# Patient Record
Sex: Female | Born: 1985
Health system: Southern US, Community
[De-identification: ages and names within clinical notes are randomized; demographics above are authoritative.]

## PROBLEM LIST (undated history)

## (undated) DIAGNOSIS — Z973 Presence of spectacles and contact lenses: Secondary | ICD-10-CM

## (undated) DIAGNOSIS — R519 Headache, unspecified: Secondary | ICD-10-CM

## (undated) DIAGNOSIS — G4730 Sleep apnea, unspecified: Secondary | ICD-10-CM

## (undated) DIAGNOSIS — N939 Abnormal uterine and vaginal bleeding, unspecified: Secondary | ICD-10-CM

## (undated) DIAGNOSIS — Z8669 Personal history of other diseases of the nervous system and sense organs: Secondary | ICD-10-CM

## (undated) DIAGNOSIS — I429 Cardiomyopathy, unspecified: Secondary | ICD-10-CM

## (undated) DIAGNOSIS — I509 Heart failure, unspecified: Secondary | ICD-10-CM

## (undated) DIAGNOSIS — D649 Anemia, unspecified: Secondary | ICD-10-CM

## (undated) HISTORY — DX: Heart failure, unspecified: I50.9

## (undated) HISTORY — PX: ENDOMETRIAL ABLATION: SHX621

## (undated) HISTORY — PX: APPENDECTOMY: SHX54

## (undated) HISTORY — DX: Anemia, unspecified: D64.9

---

## 2007-12-12 DIAGNOSIS — Z9289 Personal history of other medical treatment: Secondary | ICD-10-CM

## 2007-12-12 DIAGNOSIS — I429 Cardiomyopathy, unspecified: Secondary | ICD-10-CM

## 2007-12-12 HISTORY — DX: Personal history of other medical treatment: Z92.89

## 2007-12-12 HISTORY — DX: Cardiomyopathy, unspecified: I42.9

## 2016-07-17 DIAGNOSIS — N879 Dysplasia of cervix uteri, unspecified: Secondary | ICD-10-CM

## 2016-07-17 HISTORY — DX: Dysplasia of cervix uteri, unspecified: N87.9

## 2017-06-20 DIAGNOSIS — O0930 Supervision of pregnancy with insufficient antenatal care, unspecified trimester: Secondary | ICD-10-CM

## 2017-06-20 DIAGNOSIS — O4440 Low lying placenta NOS or without hemorrhage, unspecified trimester: Secondary | ICD-10-CM | POA: Insufficient documentation

## 2017-06-20 HISTORY — DX: Low lying placenta nos or without hemorrhage, unspecified trimester: O44.40

## 2017-06-20 HISTORY — DX: Supervision of pregnancy with insufficient antenatal care, unspecified trimester: O09.30

## 2017-06-21 ENCOUNTER — Other Ambulatory Visit (HOSPITAL_COMMUNITY): Payer: Self-pay | Admitting: Obstetrics and Gynecology

## 2017-06-22 ENCOUNTER — Encounter (HOSPITAL_COMMUNITY): Payer: Self-pay | Admitting: *Deleted

## 2017-06-26 ENCOUNTER — Ambulatory Visit (HOSPITAL_COMMUNITY)
Admission: RE | Admit: 2017-06-26 | Discharge: 2017-06-26 | Disposition: A | Discharge status: Home / Self Care | Payer: BLUE CROSS/BLUE SHIELD | Source: Ambulatory Visit | Attending: Obstetrics and Gynecology | Admitting: Obstetrics and Gynecology

## 2017-06-26 ENCOUNTER — Encounter (HOSPITAL_COMMUNITY): Payer: Self-pay

## 2017-06-26 DIAGNOSIS — F17210 Nicotine dependence, cigarettes, uncomplicated: Secondary | ICD-10-CM | POA: Insufficient documentation

## 2017-06-26 DIAGNOSIS — Z3A29 29 weeks gestation of pregnancy: Secondary | ICD-10-CM | POA: Diagnosis not present

## 2017-06-26 DIAGNOSIS — O09893 Supervision of other high risk pregnancies, third trimester: Secondary | ICD-10-CM | POA: Insufficient documentation

## 2017-06-26 DIAGNOSIS — Z8679 Personal history of other diseases of the circulatory system: Secondary | ICD-10-CM | POA: Insufficient documentation

## 2017-06-26 DIAGNOSIS — O99333 Smoking (tobacco) complicating pregnancy, third trimester: Secondary | ICD-10-CM | POA: Insufficient documentation

## 2017-06-26 HISTORY — DX: Cardiomyopathy, unspecified: I42.9

## 2017-06-26 NOTE — Consult Note (Signed)
Maternal Fetal Medicine Consultation  Requesting Provider(s): Dr. Lamona Curl  Reason for consultation: History of peripartum cardiomyopathy  HPI: Kristin Parrish is a 31 yo G4P3003, EDD 09/06/2017 who is currently at 29w 5d seen for consultation due to a personal history of peripartum cardiomyopathy.  Kristin Parrish reports that approximately 48 hours following her delivery in 2009, she developed shortness of breath and was admitted to the ICU at [facility] for approximately 4 days.  Unfortunately, records from that admission are not available.  She reports that she was discharged home on Lisinopril and "a water pill".  She was followed by cardiology for approximately 4 months and had complete resolution of her symptoms and her follow up echo by her report was normal.  She has not seen a cardiologist since 2010, but is scheduled to see a Cardiologist in [place] on the 25th of July.  This was an unplanned pregnancy and she only recently initiated care - she reports that her pregnancy in 2009 was associated with diffuse swelling and weight gain that she has not experienced up to this point.  She denies chest pain, shortness of breath or PND. The fetus is active.  OB History: OB History    Gravida Para Term Preterm AB Living   4 3 3     3    SAB TAB Ectopic Multiple Live Births                  PMH:  Past Medical History:  Diagnosis Date  . Cardiomyopathy (HCC)    postpartum    PSH:  Past Surgical History:  Procedure Laterality Date  . APPENDECTOMY     Meds:  No current outpatient prescriptions on file prior to encounter.   No current facility-administered medications on file prior to encounter.    Allergies: No Known Allergies   FH: Denies family history of birth defects or hereditary disorders  Soc:  Social History   Social History  . Marital status: Married    Spouse name: N/A  . Number of children: N/A  . Years of education: N/A   Occupational History  . Not on file.    Social History Main Topics  . Smoking status: Current Some Day Smoker    Packs/day: 0.25    Types: Cigarettes  . Smokeless tobacco: Never Used  . Alcohol use No  . Drug use: No  . Sexual activity: Not on file   Other Topics Concern  . Not on file   Social History Narrative  . No narrative on file    Review of Systems: no vaginal bleeding or cramping/contractions, no LOF, no nausea/vomiting. All other systems reviewed and are negative.  PE:   Vitals:   06/26/17 0857  BP: 105/71  Pulse: 95    A/P: 1) Single IUP at 29w 5d  2) History of peripartum cardiomyopathy  - Records were not available for the patient's previous hospitalization at the time of her evaluation.  From her discussion, she was released from Cardiology care some 4 months following her delivery and has not seen a cardiologist since that time.   We had a long discussion regarding recurrence risks of peripartum cardiomyopathy.  Fortunately, it would appear that her LV function improved and she has been asymptomatic.   Among women in whom LV function returns to normal, the risk of subsequent pregnancy appears lower than for those with persistent LV dysfunction, but elevated compared with the general population. In a series of 28 women who recovered to an  LVEF ?50 percent after the initial episode, the following results in subsequent pregnancies were noted (Elkayam U, et al. Kavin LeechNEJM [id number]):  -There were no deaths -There was a reduction in the mean LVEF (56 to 49 percent), and the LVEF fell by more than 20 percent in six women (21 percent) - Six patients developed HF symptoms  Given the patients risk for worsening LV function and heart failure (~ 20%), I would recommend that her care be transferred [facility] ([facility]).  I have initiated the paperwork to a transfer OB appointment.  Recommendations: 1) [facility] 2) Cardiology  consultation and Echo as some as possible - will likely need follow up with cardiology again prior to delivery 3) Consider telemetry during labor 4) Will need to remain in house post partum for at least 72 hours due to fluid shifts after delivery.  Thank you for the opportunity to be a part of the care of Kristin Parrish. Please contact our office if we can be of further assistance.   I spent approximately 30 minutes with this patient with over 50% of time spent in face-to-face counseling.   Alpha GulaPaul Rianne Degraaf, MD Maternal Fetal Medicine

## 2017-06-28 ENCOUNTER — Encounter (HOSPITAL_COMMUNITY): Payer: Self-pay

## 2017-06-28 DIAGNOSIS — O903 Peripartum cardiomyopathy: Secondary | ICD-10-CM

## 2017-06-28 DIAGNOSIS — Z720 Tobacco use: Secondary | ICD-10-CM | POA: Insufficient documentation

## 2017-06-28 DIAGNOSIS — O34513 Maternal care for incarceration of gravid uterus, third trimester: Secondary | ICD-10-CM

## 2017-06-28 HISTORY — DX: Tobacco use: Z72.0

## 2017-06-28 HISTORY — DX: Maternal care for incarceration of gravid uterus, third trimester: O34.513

## 2017-06-28 HISTORY — DX: Peripartum cardiomyopathy: O90.3

## 2017-11-10 HISTORY — PX: OTHER SURGICAL HISTORY: SHX169

## 2017-11-29 ENCOUNTER — Encounter (HOSPITAL_COMMUNITY): Payer: Self-pay

## 2020-12-11 DIAGNOSIS — Z8616 Personal history of COVID-19: Secondary | ICD-10-CM

## 2020-12-11 HISTORY — DX: Personal history of COVID-19: Z86.16

## 2021-01-17 ENCOUNTER — Ambulatory Visit (INDEPENDENT_AMBULATORY_CARE_PROVIDER_SITE_OTHER): Payer: 59 | Admitting: Cardiology

## 2021-01-17 ENCOUNTER — Ambulatory Visit (INDEPENDENT_AMBULATORY_CARE_PROVIDER_SITE_OTHER): Payer: 59

## 2021-01-17 ENCOUNTER — Other Ambulatory Visit: Payer: Self-pay

## 2021-01-17 VITALS — BP 110/76 | HR 73 | Ht 63.0 in | Wt 126.0 lb

## 2021-01-17 DIAGNOSIS — D509 Iron deficiency anemia, unspecified: Secondary | ICD-10-CM | POA: Diagnosis not present

## 2021-01-17 DIAGNOSIS — O903 Peripartum cardiomyopathy: Secondary | ICD-10-CM

## 2021-01-17 DIAGNOSIS — G4733 Obstructive sleep apnea (adult) (pediatric): Secondary | ICD-10-CM | POA: Diagnosis not present

## 2021-01-17 DIAGNOSIS — U071 COVID-19: Secondary | ICD-10-CM

## 2021-01-17 DIAGNOSIS — Z720 Tobacco use: Secondary | ICD-10-CM | POA: Diagnosis not present

## 2021-01-17 DIAGNOSIS — E8881 Metabolic syndrome: Secondary | ICD-10-CM

## 2021-01-17 LAB — ECHOCARDIOGRAM COMPLETE
Area-P 1/2: 3.51 cm2
Height: 63 in
S' Lateral: 3.6 cm
Weight: 2016 oz

## 2021-01-17 NOTE — Progress Notes (Signed)
KG

## 2021-01-17 NOTE — Patient Instructions (Signed)
Medication Instructions:  Your physician recommends that you continue on your current medications as directed. Please refer to the Current Medication list given to you today.  *If you need a refill on your cardiac medications before your next appointment, please call your pharmacy*   Lab Work: Your physician recommends that you return for lab work: Tomorrow Lipids (fasting blood work)  If you have labs (blood work) drawn today and your tests are completely normal, you will receive your results only by: Marland Kitchen MyChart Message (if you have MyChart) OR . A paper copy in the mail If you have any lab test that is abnormal or we need to change your treatment, we will call you to review the results.   Testing/Procedures: Your physician has requested that you have an echocardiogram. Echocardiography is a painless test that uses sound waves to create images of your heart. It provides your doctor with information about the size and shape of your heart and how well your heart's chambers and valves are working. This procedure takes approximately one hour. There are no restrictions for this procedure.    Follow-Up: At [facility], you and your health needs are our priority.  As part of our continuing mission to provide you with exceptional heart care, we have created designated Provider Care Teams.  These Care Teams include your primary Cardiologist (physician) and Advanced Practice Providers (APPs -  Physician Assistants and Nurse Practitioners) who all work together to provide you with the care you need, when you need it.  We recommend signing up for the patient portal called "MyChart".  Sign up information is provided on this After Visit Summary.  MyChart is used to connect with patients for Virtual Visits (Telemedicine).  Patients are able to view lab/test results, encounter notes, upcoming appointments, etc.  Non-urgent messages can be sent to your provider as well.   To learn more about what you can  do with MyChart, go to [email or web address].    Your next appointment:    As needed  The format for your next appointment:   In Person  Provider:   Thomasene Ripple, DO   Other Instructions

## 2021-01-17 NOTE — Progress Notes (Signed)
Complete echocardiogram has been performed.  Jimmy Pharrah Rottman RDCS, RVT 

## 2021-01-17 NOTE — Progress Notes (Signed)
Cardiology Office Note:    Date:  01/17/2021   ID:  Kristin Parrish, DOB [birth year], MRN [id number]  PCP:  Abner Greenspan, MD  Cardiologist:  Thomasene Ripple, DO  Electrophysiologist:  None   Referring MD: Abner Greenspan, MD   Recent covid infection  History of Present Illness:    Kristin Parrish is a 35 y.o. female with a hx of postpartum cardiomyopathy, iron deficiency anemia, tobacco use, sleep apnea is here today to be evaluated for mild shortness of breath post COVID-19 infection.  The patient tells me that during the third trimester of her third pregnancy she experienced some intermittent shortness of breath and bilateral leg edema.  At that time it was noted to be related to her pregnancy.  The patient did have significant anemia after delivery with shortness of breath which led her back to the emergency department at [facility].  During that visit she was started on IV Lasix as well as treated in the ICU as she did have severe anemia.  At that time she was noted to have postpartum cardiomyopathy unfortunately she does not remember what her EF was.  She did follow-up with cardiology for short while in was titrated off her medications eventually.  Not seen cardiology since.  She is concerned recently given her history of postpartum cardiomyopathy her recent COVID-19 infection to get back and establish with cardiology.  No other complaints at this time.  Past Medical History:  Diagnosis Date  . Cardiomyopathy (HCC)    postpartum  . Cervical dysplasia 07/17/2016  . Incarcerated gravid uterus in third trimester 06/28/2017  . Late prenatal care 06/20/2017  . Low-lying placenta 06/20/2017   Overview:  Repeat u/s in 4 weeks  . Postpartum cardiomyopathy 06/28/2017   postpartum  . Tobacco abuse 06/28/2017    Past Surgical History:  Procedure Laterality Date  . APPENDECTOMY      Current Medications: No outpatient medications have been marked as taking for the 01/17/21 encounter (Office Visit)  with Thomasene Ripple, DO.     Allergies:   Patient has no known allergies.   Social History   Socioeconomic History  . Marital status: Legally Separated    Spouse name: Not on file  . Number of children: Not on file  . Years of education: Not on file  . Highest education level: Not on file  Occupational History  . Not on file  Tobacco Use  . Smoking status: Current Some Day Smoker    Packs/day: 0.25    Types: Cigarettes  . Smokeless tobacco: Never Used  Substance and Sexual Activity  . Alcohol use: No  . Drug use: No  . Sexual activity: Not on file  Other Topics Concern  . Not on file  Social History Narrative  . Not on file   Social Determinants of Health   Financial Resource Strain: Not on file  Food Insecurity: Not on file  Transportation Needs: Not on file  Physical Activity: Not on file  Stress: Not on file  Social Connections: Not on file     Family History: The patient's family history includes Heart Problems in her paternal grandfather.  ROS:   Review of Systems  Constitution: Negative for decreased appetite, fever and weight gain.  HENT: Negative for congestion, ear discharge, hoarse voice and sore throat.   Eyes: Negative for discharge, redness, vision loss in right eye and visual halos.  Cardiovascular: Negative for chest pain, dyspnea on exertion, leg swelling, orthopnea and palpitations.  Respiratory: Negative for  cough, hemoptysis, shortness of breath and snoring.   Endocrine: Negative for heat intolerance and polyphagia.  Hematologic/Lymphatic: Negative for bleeding problem. Does not bruise/bleed easily.  Skin: Negative for flushing, nail changes, rash and suspicious lesions.  Musculoskeletal: Negative for arthritis, joint pain, muscle cramps, myalgias, neck pain and stiffness.  Gastrointestinal: Negative for abdominal pain, bowel incontinence, diarrhea and excessive appetite.  Genitourinary: Negative for decreased libido, genital sores and incomplete  emptying.  Neurological: Negative for brief paralysis, focal weakness, headaches and loss of balance.  Psychiatric/Behavioral: Negative for altered mental status, depression and suicidal ideas.  Allergic/Immunologic: Negative for HIV exposure and persistent infections.    EKGs/Labs/Other Studies Reviewed:    The following studies were reviewed today:   EKG:  The ekg ordered today demonstrates sinus rhythm, heart rate 72 bpm with nonspecific ST changes.  Recent Labs: No results found for requested labs within last 8760 hours.  Recent Lipid Panel No results found for: CHOL, TRIG, HDL, CHOLHDL, VLDL, LDLCALC, LDLDIRECT  Physical Exam:    VS:  BP 110/76 (BP Location: Left Arm)   Pulse 73   Ht 5\' 3"  (1.6 m)   Wt 126 lb (57.2 kg)   SpO2 98%   BMI 22.32 kg/m     Wt Readings from Last 3 Encounters:  01/17/21 126 lb (57.2 kg)  06/26/17 151 lb 3.2 oz (68.6 kg)     GEN: Well nourished, well developed in no acute distress HEENT: Normal NECK: No JVD; No carotid bruits LYMPHATICS: No lymphadenopathy CARDIAC: S1S2 noted,RRR, no murmurs, rubs, gallops RESPIRATORY:  Clear to auscultation without rales, wheezing or rhonchi  ABDOMEN: Soft, non-tender, non-distended, +bowel sounds, no guarding. EXTREMITIES: No edema, No cyanosis, no clubbing MUSCULOSKELETAL:  No deformity  SKIN: Warm and dry NEUROLOGIC:  Alert and oriented x 3, non-focal PSYCHIATRIC:  Normal affect, good insight  ASSESSMENT:    1. COVID-19   2. Postpartum cardiomyopathy   3. Iron deficiency anemia, unspecified iron deficiency anemia type   4. Metabolic syndrome   5. Tobacco abuse   6. OSA (obstructive sleep apnea)    PLAN:     What I would like to do is get an echocardiogram to establish a new baseline for her LVEF.  Especially in the setting of her recent COVID-19 infection and her history of postpartum cardiomyopathy.  We also discussed for getting a baseline on the lipid profile.  Smoking cessation  advised.  The patient is in agreement with the above plan. The patient left the office in stable condition.  The patient will follow up as needed   Medication Adjustments/Labs and Tests Ordered: Current medicines are reviewed at length with the patient today.  Concerns regarding medicines are outlined above.  Orders Placed This Encounter  Procedures  . Lipid panel  . EKG 12-Lead  . ECHOCARDIOGRAM COMPLETE   No orders of the defined types were placed in this encounter.   Patient Instructions  Medication Instructions:  Your physician recommends that you continue on your current medications as directed. Please refer to the Current Medication list given to you today.  *If you need a refill on your cardiac medications before your next appointment, please call your pharmacy*   Lab Work: Your physician recommends that you return for lab work: Tomorrow Lipids (fasting blood work)  If you have labs (blood work) drawn today and your tests are completely normal, you will receive your results only by: 06/28/17 MyChart Message (if you have MyChart) OR . A paper copy in the mail  If you have any lab test that is abnormal or we need to change your treatment, we will call you to review the results.   Testing/Procedures: Your physician has requested that you have an echocardiogram. Echocardiography is a painless test that uses sound waves to create images of your heart. It provides your doctor with information about the size and shape of your heart and how well your heart's chambers and valves are working. This procedure takes approximately one hour. There are no restrictions for this procedure.    Follow-Up: At [facility], you and your health needs are our priority.  As part of our continuing mission to provide you with exceptional heart care, we have created designated Provider Care Teams.  These Care Teams include your primary Cardiologist (physician) and Advanced Practice Providers (APPs -   Physician Assistants and Nurse Practitioners) who all work together to provide you with the care you need, when you need it.  We recommend signing up for the patient portal called "MyChart".  Sign up information is provided on this After Visit Summary.  MyChart is used to connect with patients for Virtual Visits (Telemedicine).  Patients are able to view lab/test results, encounter notes, upcoming appointments, etc.  Non-urgent messages can be sent to your provider as well.   To learn more about what you can do with MyChart, go to [email or web address].    Your next appointment:    As needed  The format for your next appointment:   In Person  Provider:   Thomasene Ripple, DO   Other Instructions      Adopting a Healthy Lifestyle.  Know what a healthy weight is for you (roughly BMI <25) and aim to maintain this   Aim for 7+ servings of fruits and vegetables daily   65-80+ fluid ounces of water or unsweet tea for healthy kidneys   Limit to max 1 drink of alcohol per day; avoid smoking/tobacco   Limit animal fats in diet for cholesterol and heart health - choose grass fed whenever available   Avoid highly processed foods, and foods high in saturated/trans fats   Aim for low stress - take time to unwind and care for your mental health   Aim for 150 min of moderate intensity exercise weekly for heart health, and weights twice weekly for bone health   Aim for 7-9 hours of sleep daily   When it comes to diets, agreement about the perfect plan isnt easy to find, even among the experts. Experts at the [facility] [employer] developed an idea known as the Healthy Eating Plate. Just imagine a plate divided into logical, healthy portions.   The emphasis is on diet quality:   Load up on vegetables and fruits - one-half of your plate: Aim for color and variety, and remember that potatoes dont count.   Go for whole grains - one-quarter of your plate: Whole wheat, barley, wheat  berries, quinoa, oats, brown rice, and foods made with them. If you want pasta, go with whole wheat pasta.   Protein power - one-quarter of your plate: Fish, chicken, beans, and nuts are all healthy, versatile protein sources. Limit red meat.   The diet, however, does go beyond the plate, offering a few other suggestions.   Use healthy plant oils, such as olive, canola, soy, corn, sunflower and peanut. Check the labels, and avoid partially hydrogenated oil, which have unhealthy trans fats.   If youre thirsty, drink water. Coffee and tea are good in  moderation, but skip sugary drinks and limit milk and dairy products to one or two daily servings.   The type of carbohydrate in the diet is more important than the amount. Some sources of carbohydrates, such as vegetables, fruits, whole grains, and beans-are healthier than others.   Finally, stay active  Signed, Thomasene Ripple, DO  01/17/2021 12:12 PM    [facility]

## 2021-01-27 LAB — LIPID PANEL
Chol/HDL Ratio: 2.5 ratio (ref 0.0–4.4)
Cholesterol, Total: 148 mg/dL (ref 100–199)
HDL: 60 mg/dL (ref >39)
LDL Chol Calc (NIH): 77 mg/dL (ref 0–99)
Triglycerides: 51 mg/dL (ref 0–149)
VLDL Cholesterol Cal: 11 mg/dL (ref 5–40)

## 2021-04-26 DIAGNOSIS — F4321 Adjustment disorder with depressed mood: Secondary | ICD-10-CM | POA: Diagnosis not present

## 2021-06-07 ENCOUNTER — Other Ambulatory Visit: Payer: Self-pay

## 2021-06-07 ENCOUNTER — Encounter: Payer: Self-pay | Admitting: Family Medicine

## 2021-06-07 ENCOUNTER — Ambulatory Visit (INDEPENDENT_AMBULATORY_CARE_PROVIDER_SITE_OTHER): Payer: 59 | Admitting: Family Medicine

## 2021-06-07 VITALS — BP 102/64 | HR 74 | Temp 98.5°F | Ht 64.0 in | Wt 130.0 lb

## 2021-06-07 DIAGNOSIS — Z8669 Personal history of other diseases of the nervous system and sense organs: Secondary | ICD-10-CM

## 2021-06-07 DIAGNOSIS — Z0000 Encounter for general adult medical examination without abnormal findings: Secondary | ICD-10-CM

## 2021-06-07 DIAGNOSIS — Z1159 Encounter for screening for other viral diseases: Secondary | ICD-10-CM | POA: Diagnosis not present

## 2021-06-07 LAB — COMPREHENSIVE METABOLIC PANEL
ALT: 10 U/L (ref 0–35)
AST: 13 U/L (ref 0–37)
Albumin: 4.4 g/dL (ref 3.5–5.2)
Alkaline Phosphatase: 69 U/L (ref 39–117)
BUN: 12 mg/dL (ref 6–23)
CO2: 28 mEq/L (ref 19–32)
Calcium: 9.2 mg/dL (ref 8.4–10.5)
Chloride: 103 mEq/L (ref 96–112)
Creatinine, Ser: 0.7 mg/dL (ref 0.40–1.20)
GFR: 111.76 mL/min (ref >60.00)
Glucose, Bld: 81 mg/dL (ref 70–99)
Potassium: 3.8 mEq/L (ref 3.5–5.1)
Sodium: 137 mEq/L (ref 135–145)
Total Bilirubin: 0.4 mg/dL (ref 0.2–1.2)
Total Protein: 6.7 g/dL (ref 6.0–8.3)

## 2021-06-07 LAB — CBC
HCT: 38 % (ref 36.0–46.0)
Hemoglobin: 12.6 g/dL (ref 12.0–15.0)
MCHC: 33.2 g/dL (ref 30.0–36.0)
MCV: 94.1 fl (ref 78.0–100.0)
Platelets: 220 10*3/uL (ref 150.0–400.0)
RBC: 4.04 Mil/uL (ref 3.87–5.11)
RDW: 12.5 % (ref 11.5–15.5)
WBC: 7 10*3/uL (ref 4.0–10.5)

## 2021-06-07 LAB — LIPID PANEL
Cholesterol: 165 mg/dL (ref 0–200)
HDL: 56.4 mg/dL (ref >39.00)
LDL Cholesterol: 75 mg/dL (ref 0–99)
NonHDL: 108.26
Total CHOL/HDL Ratio: 3
Triglycerides: 167 mg/dL — ABNORMAL HIGH (ref 0.0–149.0)
VLDL: 33.4 mg/dL (ref 0.0–40.0)

## 2021-06-07 NOTE — Progress Notes (Signed)
Chief Complaint  Patient presents with   Annual Exam     Well Woman Kristin Parrish is here for a complete physical.   Her last physical was >1 year ago.  Current diet: in general, diet is fair.  Current exercise: active w kids. Fatigue out of ordinary? No Seatbelt? Yes  Health Maintenance Pap/HPV- due Tetanus- Yes HIV screening- Yes Hep C screening- No  Past Medical History:  Diagnosis Date   Cardiomyopathy (HCC)    postpartum   Cervical dysplasia 07/17/2016   Incarcerated gravid uterus in third trimester 06/28/2017   Late prenatal care 06/20/2017   Low-lying placenta 06/20/2017   Overview:  Repeat u/s in 4 weeks   Postpartum cardiomyopathy 06/28/2017   postpartum   Tobacco abuse 06/28/2017     Past Surgical History:  Procedure Laterality Date   APPENDECTOMY      Medications  Takes no meds routinely.    Allergies No Known Allergies  Review of Systems: Constitutional:  no unexpected weight changes Eye:  no recent significant change in vision Ear/Nose/Mouth/Throat:  Ears:  no tinnitus or vertigo and no recent change in hearing Nose/Mouth/Throat:  no complaints of nasal congestion, no sore throat Cardiovascular: no chest pain Respiratory:  no cough and no shortness of breath Gastrointestinal:  no abdominal pain, no change in bowel habits GU:  Female: negative for dysuria or pelvic pain Musculoskeletal/Extremities:  no pain of the joints Integumentary (Skin/Breast):  no abnormal skin lesions reported Neurologic:  no headaches Endocrine:  denies fatigue Hematologic/Lymphatic:  No areas of easy bleeding  Exam BP 102/64   Pulse 74   Temp 98.5 F (36.9 C) (Oral)   Ht 5\' 4"  (1.626 m)   Wt 130 lb (59 kg)   SpO2 99%   BMI 22.31 kg/m  General:  well developed, well nourished, in no apparent distress Skin:  no significant moles, warts, or growths Head:  no masses, lesions, or tenderness Eyes:  pupils equal and round, sclera anicteric without injection Ears:   canals without lesions, TMs shiny without retraction, no obvious effusion, no erythema Nose:  nares patent, septum midline, mucosa normal, and no drainage or sinus tenderness Throat/Pharynx:  lips and gingiva without lesion; tongue and uvula midline; non-inflamed pharynx; no exudates or postnasal drainage Neck: neck supple without adenopathy, thyromegaly, or masses Lungs:  clear to auscultation, breath sounds equal bilaterally, no respiratory distress Cardio:  regular rate and rhythm, no bruits, no LE edema Abdomen:  abdomen soft, nontender; bowel sounds normal; no masses or organomegaly Genital: Defer to GYN Musculoskeletal:  symmetrical muscle groups noted without atrophy or deformity Extremities:  no clubbing, cyanosis, or edema, no deformities, no skin discoloration Neuro:  gait normal; deep tendon reflexes normal and symmetric Psych: well oriented with normal range of affect and appropriate judgment/insight  Assessment and Plan  Well adult exam - Plan: CBC, Comprehensive metabolic panel, Lipid panel  History of sleep apnea - Plan: Ambulatory referral to Pulmonology  Encounter for hepatitis C screening test for low risk patient - Plan: Hepatitis C antibody   [name]. Counseled on diet and exercise. Other orders as above. Follow up in 1 yr or prn. The patient voiced understanding and agreement to the plan.  [address], DO 06/07/21 1:10 PM

## 2021-06-07 NOTE — Patient Instructions (Addendum)
Call [facility] [place] at [phone] for an appointment.  They are located at [address], [address], [place], [place], [address] (right across the hall from our office).  Give us 2-3 business days to get the results of your labs back.   Keep the diet clean and stay active.  If you do not hear anything about your referral in the next 1-2 weeks, call our office and ask for an update.  Let us know if you need anything. 

## 2021-06-08 LAB — HEPATITIS C ANTIBODY
Hepatitis C Ab: NONREACTIVE
SIGNAL TO CUT-OFF: 0.04 (ref <1.00)

## 2021-08-05 NOTE — Progress Notes (Signed)
08/08/21- 34 yoF Smoker for sleep evaluation courtesy of Dr Carmelia Roller with hx OSA Medical problem list includes Cardiomyopathy with pregnancy 2009, OSA, Tobacco Abuse, Adjustment Disorder, Covid infection Feb 2022,  Epworth score-12 Body weight today- Covid vax-2 Phizer -----Patient states that she was diagnosed with CHF/ CM in 2009 after having daughter. Was told in the hospital that she stops breathing and snores. Did Sleep study in 2009 and was given cpap but no longer has the machine (lost during marital separation).. Patient states that she sometimes has trouble going to sleep. Snores on back. 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 Smoking 1/2 ppd.- denies lung/ breathing problems. No ENT surgery. No fam hx OSA.  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   postpartum   Tobacco abuse 06/28/2017   Past Surgical History:  Procedure Laterality Date   APPENDECTOMY     Family History  Problem Relation Age of Onset   Heart Problems Paternal Grandfather    Social History   Socioeconomic History   Marital status: Legally Separated    Spouse name: Not on file   Number of children: Not on file   Years of education: Not on file   Highest education level: Not on file  Occupational History   Not on file  Tobacco Use   Smoking status: Every Day    Packs/day: 0.25    Types: Cigarettes   Smokeless tobacco: Never   Tobacco comments:    1/2 pack a day [facility] 08/08/21  Vaping Use   Vaping Use: Never used  Substance and Sexual Activity   Alcohol use: No   Drug use: No   Sexual activity: Not on file  Other Topics Concern   Not on file   Social History Narrative   Not on file   Social Determinants of Health   Financial Resource Strain: Not on file  Food Insecurity: Not on file  Transportation Needs: Not on file  Physical Activity: Not on file  Stress: Not on file  Social Connections: Not on file  Intimate Partner Violence: Not on file   ROS-see HPI   + = positive Constitutional:    +weight loss, night sweats, fevers, chills, fatigue, lassitude. HEENT:    +headaches, difficulty swallowing, tooth/dental problems, sore throat,       sneezing, itching, ear ache, nasal congestion, post nasal drip, snoring CV:    chest pain, orthopnea, PND, swelling in lower extremities, anasarca,                                   dizziness, palpitations Resp:   +shortness of breath with exertion or at rest.                productive cough,   non-productive cough, coughing up of blood.              change in color of mucus.  wheezing.   Skin:    rash or lesions. GI:  No-   heartburn, indigestion, +abdominal pain, nausea, vomiting, diarrhea,  change in bowel habits, loss of appetite GU: dysuria, change in color of urine, no urgency or frequency.   flank pain. MS:   joint pain, stiffness, decreased range of motion, back pain. Neuro-     nothing unusual Psych:  change in mood or affect.  depression or anxiety.   memory loss.   OBJ- Physical Exam General- Alert, Oriented, Affect-appropriate, Distress- none acute, = not obese Skin- rash-none, lesions- none, excoriation- none Lymphadenopathy- none Head- atraumatic, +small mandible            Eyes- Gross vision intact, PERRLA, conjunctivae and secretions clear            Ears- Hearing, canals-normal            Nose- Clear, no-Septal dev, mucus, polyps, erosion, perforation             Throat- Mallampati III-IV, mucosa clear , drainage- none, tonsils- atrophic, + teeth Neck- flexible , trachea midline, no stridor , thyroid nl, carotid no bruit Chest - symmetrical excursion  , unlabored           Heart/CV- RRR , no murmur , no gallop  , no rub, nl s1 s2                           - JVD- none , edema- none, stasis changes- none, varices- none           Lung- clear to P&A, wheeze- none, cough- none , dullness-none, rub- none           Chest wall-  Abd-  Br/ Gen/ Rectal- Not done, not indicated Extrem- cyanosis- none, clubbing, none, atrophy- none, strength- nl Neuro- grossly intact to observation

## 2021-08-08 ENCOUNTER — Ambulatory Visit (INDEPENDENT_AMBULATORY_CARE_PROVIDER_SITE_OTHER): Payer: 59 | Admitting: Internal Medicine

## 2021-08-08 ENCOUNTER — Other Ambulatory Visit: Payer: Self-pay

## 2021-08-08 ENCOUNTER — Encounter: Payer: Self-pay | Admitting: Internal Medicine

## 2021-08-08 VITALS — BP 100/68 | HR 92 | Temp 99.1°F | Ht 64.0 in | Wt 128.6 lb

## 2021-08-08 DIAGNOSIS — Z720 Tobacco use: Secondary | ICD-10-CM

## 2021-08-08 DIAGNOSIS — G4733 Obstructive sleep apnea (adult) (pediatric): Secondary | ICD-10-CM

## 2021-08-08 DIAGNOSIS — R0683 Snoring: Secondary | ICD-10-CM

## 2021-08-08 NOTE — Patient Instructions (Signed)
Order- schedule home sleep test   dx OSA  Please call us for results and recommendations about 2 weeks after your sleep test 

## 2021-08-09 ENCOUNTER — Encounter: Payer: Self-pay | Admitting: Internal Medicine

## 2021-08-09 NOTE — Assessment & Plan Note (Signed)
Emphasis on smoking cessation 

## 2021-08-09 NOTE — Assessment & Plan Note (Addendum)
Need to update documentation. Original dx was at time of post-partum cardiomyopathy Appropriate discussion, question/ answers Plan- sleep study, then possibly CPAP or oral appliance

## 2021-08-10 ENCOUNTER — Encounter: Payer: Self-pay | Admitting: Family Medicine

## 2021-08-10 ENCOUNTER — Ambulatory Visit (INDEPENDENT_AMBULATORY_CARE_PROVIDER_SITE_OTHER): Payer: 59 | Admitting: Family Medicine

## 2021-08-10 ENCOUNTER — Other Ambulatory Visit (HOSPITAL_COMMUNITY)
Admission: RE | Admit: 2021-08-10 | Discharge: 2021-08-10 | Disposition: A | Discharge status: Home / Self Care | Payer: 59 | Source: Ambulatory Visit | Attending: Family Medicine | Admitting: Family Medicine

## 2021-08-10 ENCOUNTER — Other Ambulatory Visit: Payer: Self-pay

## 2021-08-10 VITALS — BP 107/77 | HR 75 | Ht 63.0 in | Wt 130.0 lb

## 2021-08-10 DIAGNOSIS — Z01419 Encounter for gynecological examination (general) (routine) without abnormal findings: Secondary | ICD-10-CM

## 2021-08-10 DIAGNOSIS — Z8742 Personal history of other diseases of the female genital tract: Secondary | ICD-10-CM | POA: Diagnosis not present

## 2021-08-10 DIAGNOSIS — N926 Irregular menstruation, unspecified: Secondary | ICD-10-CM

## 2021-08-10 MED ORDER — NORETHIN ACE-ETH ESTRAD-FE 1-20 MG-MCG(24) PO TABS: 1.0000 | ORAL_TABLET | Freq: Every day | ORAL | 11 refills | Status: DC

## 2021-08-10 NOTE — Progress Notes (Signed)
GYNECOLOGY ANNUAL PREVENTATIVE CARE ENCOUNTER NOTE  Subjective:   Kristin Parrish is a 35 y.o. [phone] female here for a routine annual gynecologic exam.  Current complaints: Irregular cycles - varying between 18 days and 35 days. Occasionally bleeds for 3 days, has 1-2 days without, then 2-3 more days of light bleeding. Denies abnormal vaginal bleeding, discharge, pelvic pain, problems with intercourse or other gynecologic concerns.    Gynecologic History Patient's last menstrual period was 08/09/2021. Patient is sexually active  Contraception: tubal ligation Last Pap:  07/2016 - ASCUS +HPV 07/2017 - ASCUS + HPV 2018 Colposcopy negative Last mammogram: n/a.  Colorectal Cancer Screening: N/A.   Obstetric History OB History  Gravida Para Term Preterm AB Living  4 4 4     4   SAB IAB Ectopic Multiple Live Births          4    # Outcome Date GA Lbr Len/2nd Weight Sex Delivery Anes PTL Lv  4 Term 2018 [redacted]w[redacted]d   F Vag-Spont None N LIV  3 Term 2009 [redacted]w[redacted]d   F Vag-Spont None N LIV  2 Term 2006 [redacted]w[redacted]d   M Vag-Spont None N LIV  1 Term 2005 [redacted]w[redacted]d   M Vag-Spont None N LIV    Past Medical History:  Diagnosis Date   Cardiomyopathy (HCC)    postpartum   Cervical dysplasia 07/17/2016   Incarcerated gravid uterus in third trimester 06/28/2017   Late prenatal care 06/20/2017   Low-lying placenta 06/20/2017   Overview:  Repeat u/s in 4 weeks   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   No current facility-administered medications on file prior to visit.    No Known Allergies  Social History   Socioeconomic History   Marital status: Legally Separated    Spouse name: Not on file   Number of children: Not on file   Years of education: Not on file   Highest education  level: Not on file  Occupational History   Not on file  Tobacco Use   Smoking status: Every Day    Packs/day: 0.25    Types: Cigarettes   Smokeless tobacco: Never   Tobacco comments:    1/2 pack a day [facility] 08/08/21  Vaping Use   Vaping Use: Never used  Substance and Sexual Activity   Alcohol use: No   Drug use: No   Sexual activity: Yes  Other Topics Concern   Not on file  Social History Narrative   Not on file   Social Determinants of Health   Financial Resource Strain: Not on file  Food Insecurity: Not on file  Transportation Needs: Not on file  Physical Activity: Not on file  Stress: Not on file  Social Connections: Not on file  Intimate Partner Violence: Not on file    Family History  Problem Relation Age of Onset   Heart Problems Paternal Grandfather     The following portions of the patient's history were reviewed and updated as appropriate: allergies, current medications, past family history, past medical history, past social history, past surgical history and problem list.  Review of Systems Pertinent items are noted in HPI.   Objective:  BP 107/77   Pulse 75   Ht 5\' 3"  (1.6 m)  Wt 130 lb (59 kg)   LMP 08/09/2021   BMI 23.03 kg/m  Wt Readings from Last 3 Encounters:  08/10/21 130 lb (59 kg)  08/08/21 128 lb 9.6 oz (58.3 kg)  06/07/21 130 lb (59 kg)     Chaperone present during exam  CONSTITUTIONAL: Well-developed, well-nourished female in no acute distress.  HENT:  Normocephalic, atraumatic, External right and left ear normal. Oropharynx is clear and moist EYES: Conjunctivae and EOM are normal. Pupils are equal, round, and reactive to light. No scleral icterus.  NECK: Normal range of motion, supple, no masses.  Normal thyroid.   CARDIOVASCULAR: Normal heart rate noted, regular rhythm RESPIRATORY: Clear to auscultation bilaterally. Effort and breath sounds normal, no problems with respiration noted. BREASTS: Symmetric in size. No masses, skin  changes, nipple drainage, or lymphadenopathy. ABDOMEN: Soft, normal bowel sounds, no distention noted.  No tenderness, rebound or guarding.  PELVIC: Normal appearing external genitalia; normal appearing vaginal mucosa and cervix.  No abnormal discharge noted.   MUSCULOSKELETAL: Normal range of motion. No tenderness.  No cyanosis, clubbing, or edema.  2+ distal pulses. SKIN: Skin is warm and dry. No rash noted. Not diaphoretic. No erythema. No pallor. NEUROLOGIC: Alert and oriented to person, place, and time. Normal reflexes, muscle tone coordination. No cranial nerve deficit noted. PSYCHIATRIC: Normal mood and affect. Normal behavior. Normal judgment and thought content.  Assessment:  Annual gynecologic examination with pap smear   Plan:  1. Well Woman Exam Will follow up results of pap smear and manage accordingly.  - Cytology - PAP( [place])  2. History of abnormal cervical Pap smear PAP today  3. Irregular menses Start COCs, loestrin.   Routine preventative health maintenance measures emphasized. Please refer to After Visit Summary for other counseling recommendations.    Candelaria Celeste, DO [facility]

## 2021-08-11 LAB — CYTOLOGY - PAP
Comment: NEGATIVE
Diagnosis: NEGATIVE
High risk HPV: NEGATIVE

## 2021-08-17 ENCOUNTER — Telehealth: Payer: 59 | Admitting: Physician Assistant

## 2021-08-17 DIAGNOSIS — B001 Herpesviral vesicular dermatitis: Secondary | ICD-10-CM

## 2021-08-17 MED ORDER — VALACYCLOVIR HCL 1 G PO TABS: 2000.0000 mg | ORAL_TABLET | Freq: Two times a day (BID) | ORAL | 0 refills | Status: AC

## 2021-08-17 NOTE — Progress Notes (Signed)

## 2021-08-17 NOTE — Progress Notes (Signed)
I have spent 5 minutes in review of e-visit questionnaire, review and updating patient chart, medical decision making and response to patient.   Ilana Prezioso Cody Ladell Lea, PA-C    

## 2021-09-07 DIAGNOSIS — R519 Headache, unspecified: Secondary | ICD-10-CM | POA: Diagnosis not present

## 2021-09-07 DIAGNOSIS — R509 Fever, unspecified: Secondary | ICD-10-CM | POA: Diagnosis not present

## 2021-11-08 ENCOUNTER — Ambulatory Visit: Payer: Medicaid Other | Admitting: Internal Medicine

## 2021-11-11 ENCOUNTER — Other Ambulatory Visit: Payer: Self-pay

## 2021-11-11 ENCOUNTER — Ambulatory Visit: Payer: 59

## 2021-11-11 DIAGNOSIS — R0683 Snoring: Secondary | ICD-10-CM

## 2021-11-11 DIAGNOSIS — G4733 Obstructive sleep apnea (adult) (pediatric): Secondary | ICD-10-CM

## 2021-11-18 DIAGNOSIS — R0683 Snoring: Secondary | ICD-10-CM | POA: Diagnosis not present

## 2021-12-06 NOTE — Progress Notes (Signed)
08/08/21- 34 yoF Smoker for sleep evaluation courtesy of Dr Carmelia Roller with hx OSA Medical problem list includes Cardiomyopathy with pregnancy 2009, OSA, Tobacco Abuse, Adjustment Disorder, Covid infection Feb 2022,  Epworth score-12 Body weight today- Covid vax-2 Phizer -----Patient states that she was diagnosed with CHF/ CM in 2009 after having daughter. Was told in the hospital that she stops breathing and snores. Did Sleep study in 2009 and was given cpap but no longer has the machine (lost during marital separation).. Patient states that she sometimes has trouble going to sleep. Snores on back. 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 Smoking 1/2 ppd.- denies lung/ breathing problems. No ENT surgery. No fam hx OSA.  12/07/21- 34 yoF Smoker followed for OSA, complicated by Cardiomyopathy with pregnancy 2009, Tobacco Abuse, Adjustment Disorder, Covid infection Feb 2022,  HST 11/14/21- AHI 5.8/ hr, desaturation to 89%, body weight 128 lbs Body weight today-136 lbs Covid vax-2 Phizer Flu vax-had She is here today with her partner to review her sleep study.  She has only very minimal sleep apnea.  They say her snoring is not bad, only noticeable if she is on her back.  She had wanted the sleep apnea issue revisited because she had had significant sleep apnea years ago during complicated pregnancy. We reviewed interventions for snoring.  I also encouraged her to stop smoking/vaping because, among other things, these may increase nasal congestion and snoring.  ROS-see HPI   + = positive Constitutional:    +weight loss, night sweats, fevers, chills, fatigue, lassitude. HEENT:    +headaches, difficulty swallowing, tooth/dental problems, sore throat,       sneezing, itching, ear ache, nasal congestion, post nasal drip, snoring CV:    chest pain, orthopnea, PND, swelling in lower extremities, anasarca,                                   dizziness,  palpitations Resp:   +shortness of breath with exertion or at rest.                productive cough,   non-productive cough, coughing up of blood.              change in color of mucus.  wheezing.   Skin:    rash or lesions. GI:  No-   heartburn, indigestion, +abdominal pain, nausea, vomiting, diarrhea,                 change in bowel habits, loss of appetite GU: dysuria, change in color of urine, no urgency or frequency.   flank pain. MS:   joint pain, stiffness, decreased range of motion, back pain. Neuro-     nothing unusual Psych:  change in mood or affect.  depression or anxiety.   memory loss.   OBJ- Physical Exam General- Alert, Oriented, Affect-appropriate, Distress- none acute, = not obese Skin- rash-none, lesions- none, excoriation- none Lymphadenopathy- none Head- atraumatic, +small mandible            Eyes- Gross vision intact, PERRLA, conjunctivae and secretions clear            Ears- Hearing, canals-normal            Nose- Clear, no-Septal dev, mucus, polyps, erosion, perforation             Throat- Mallampati III-IV, mucosa clear , drainage- none, tonsils- atrophic, + teeth Neck-  flexible , trachea midline, no stridor , thyroid nl, carotid no bruit Chest - symmetrical excursion , unlabored           Heart/CV- RRR , no murmur , no gallop  , no rub, nl s1 s2                           - JVD- none , edema- none, stasis changes- none, varices- none           Lung- clear to P&A, wheeze- none, cough- none , dullness-none, rub- none           Chest wall-  Abd-  Br/ Gen/ Rectal- Not done, not indicated Extrem- cyanosis- none, clubbing, none, atrophy- none, strength- nl Neuro- grossly intact to observation

## 2021-12-07 ENCOUNTER — Other Ambulatory Visit (HOSPITAL_BASED_OUTPATIENT_CLINIC_OR_DEPARTMENT_OTHER): Payer: Self-pay

## 2021-12-07 ENCOUNTER — Encounter: Payer: Self-pay | Admitting: Internal Medicine

## 2021-12-07 ENCOUNTER — Ambulatory Visit (INDEPENDENT_AMBULATORY_CARE_PROVIDER_SITE_OTHER): Payer: 59 | Admitting: Internal Medicine

## 2021-12-07 ENCOUNTER — Encounter: Payer: Self-pay | Admitting: Family Medicine

## 2021-12-07 ENCOUNTER — Other Ambulatory Visit: Payer: Self-pay

## 2021-12-07 ENCOUNTER — Ambulatory Visit (INDEPENDENT_AMBULATORY_CARE_PROVIDER_SITE_OTHER): Payer: 59 | Admitting: Family Medicine

## 2021-12-07 VITALS — BP 107/68 | HR 74 | Ht 63.0 in | Wt 135.0 lb

## 2021-12-07 DIAGNOSIS — Z720 Tobacco use: Secondary | ICD-10-CM

## 2021-12-07 DIAGNOSIS — N939 Abnormal uterine and vaginal bleeding, unspecified: Secondary | ICD-10-CM | POA: Diagnosis not present

## 2021-12-07 DIAGNOSIS — G4733 Obstructive sleep apnea (adult) (pediatric): Secondary | ICD-10-CM

## 2021-12-07 DIAGNOSIS — N926 Irregular menstruation, unspecified: Secondary | ICD-10-CM | POA: Diagnosis not present

## 2021-12-07 MED ORDER — MEGESTROL ACETATE 40 MG PO TABS
40.0000 mg | ORAL_TABLET | Freq: Every day | ORAL | 5 refills | Status: DC
  Filled 2021-12-07: qty 60, 15d supply, fill #0
  Filled 2022-01-17: qty 60, 15d supply, fill #1

## 2021-12-07 NOTE — Assessment & Plan Note (Signed)
I encouraged cessation.  I offered contact with our pharmacy team which has a telephone smoking cessation program but she did not accept at this time.

## 2021-12-07 NOTE — Assessment & Plan Note (Signed)
Only minimal sleep apnea not medically significant.  She just wanted closure after diagnosis during complicated pregnancy years ago.  We discussed management of snoring with encouragement to sleep off flat of back.  I also encourage smoking cessation with the argument tobacco use tends to increase nasal congestion and snoring.

## 2021-12-07 NOTE — Patient Instructions (Signed)
Please call if we can help 

## 2021-12-07 NOTE — Progress Notes (Signed)
Pt unsure of LMP Pt states OCP has not helped and she is still having multiple periods a month

## 2021-12-07 NOTE — Progress Notes (Signed)
° °  Subjective:    Patient ID: Kristin Parrish, female    DOB: 14-Aug-1986, 35 y.o.   MRN: [id number]  HPI She seen for follow-up of irregular bleeding and a UB.  She is still having approximately 3 to 4 days of moderate bleeding followed by 2 to 3 days of no bleeding, then repeats the bleeding.  There is no identifiable menses.  COCs did not change bleeding. No cramping.  She does have a headache prior to start of bleeding.  No hirsutism no weight loss weight gain.  No abnormal discharge.  No dizziness, lightheadedness  I have reviewed the patients past medical, family, and social history.  I have reviewed the patient's medication list and allergies.  Review of Systems     Objective:   Physical Exam Vitals reviewed.  Constitutional:      Appearance: Normal appearance.  Cardiovascular:     Rate and Rhythm: Normal rate and regular rhythm.  Pulmonary:     Effort: Pulmonary effort is normal.  Abdominal:     General: Abdomen is flat. There is no distension.     Palpations: Abdomen is soft. There is no mass.     Tenderness: There is no abdominal tenderness.  Skin:    Capillary Refill: Capillary refill takes less than 2 seconds.  Neurological:     General: No focal deficit present.     Mental Status: She is alert.  Psychiatric:        Mood and Affect: Mood normal.        Behavior: Behavior normal.       Assessment & Plan:  1. Abnormal uterine bleeding (AUB) Check [place], TSH, Testosterone, FSH, HgA1c. Start megace and stop loestrin.  Will evaluate for fibroids or other structural reason for bleeding. - US PELVIC COMPLETE WITH TRANSVAGINAL; Future - Thyroid Panel With TSH - Follicle stimulating hormone - Hemoglobin A1c - TestT+TestF+SHBG

## 2021-12-09 LAB — TESTT+TESTF+SHBG
Sex Hormone Binding: 102 nmol/L (ref 24.6–122.0)
Testosterone, Free: 0.8 pg/mL (ref 0.0–4.2)
Testosterone, Total, LC/MS: 16.2 ng/dL (ref 10.0–55.0)

## 2021-12-09 LAB — FOLLICLE STIMULATING HORMONE: FSH: 8.9 m[IU]/mL

## 2021-12-09 LAB — THYROID PANEL WITH TSH
Free Thyroxine Index: 1.6 (ref 1.2–4.9)
T3 Uptake Ratio: 24 % (ref 24–39)
T4, Total: 6.8 ug/dL (ref 4.5–12.0)
TSH: 1.17 u[IU]/mL (ref 0.450–4.500)

## 2021-12-09 LAB — HEMOGLOBIN A1C
Est. average glucose Bld gHb Est-mCnc: 108 mg/dL
Hgb A1c MFr Bld: 5.4 % (ref 4.8–5.6)

## 2021-12-19 ENCOUNTER — Ambulatory Visit (HOSPITAL_BASED_OUTPATIENT_CLINIC_OR_DEPARTMENT_OTHER)
Admission: RE | Admit: 2021-12-19 | Discharge: 2021-12-19 | Disposition: A | Discharge status: Home / Self Care | Payer: 59 | Source: Ambulatory Visit | Attending: Family Medicine | Admitting: Family Medicine

## 2021-12-19 ENCOUNTER — Other Ambulatory Visit: Payer: Self-pay

## 2021-12-19 DIAGNOSIS — N888 Other specified noninflammatory disorders of cervix uteri: Secondary | ICD-10-CM | POA: Diagnosis not present

## 2021-12-19 DIAGNOSIS — N939 Abnormal uterine and vaginal bleeding, unspecified: Secondary | ICD-10-CM | POA: Insufficient documentation

## 2022-01-12 DIAGNOSIS — R509 Fever, unspecified: Secondary | ICD-10-CM | POA: Diagnosis not present

## 2022-01-12 DIAGNOSIS — J020 Streptococcal pharyngitis: Secondary | ICD-10-CM | POA: Diagnosis not present

## 2022-01-12 DIAGNOSIS — J039 Acute tonsillitis, unspecified: Secondary | ICD-10-CM | POA: Diagnosis not present

## 2022-01-17 ENCOUNTER — Other Ambulatory Visit (HOSPITAL_BASED_OUTPATIENT_CLINIC_OR_DEPARTMENT_OTHER): Payer: Self-pay

## 2022-01-17 ENCOUNTER — Encounter: Payer: Self-pay | Admitting: Family Medicine

## 2022-01-18 ENCOUNTER — Other Ambulatory Visit (HOSPITAL_BASED_OUTPATIENT_CLINIC_OR_DEPARTMENT_OTHER): Payer: Self-pay

## 2022-02-09 ENCOUNTER — Other Ambulatory Visit (HOSPITAL_BASED_OUTPATIENT_CLINIC_OR_DEPARTMENT_OTHER): Payer: Self-pay

## 2022-02-09 ENCOUNTER — Ambulatory Visit (INDEPENDENT_AMBULATORY_CARE_PROVIDER_SITE_OTHER): Payer: 59 | Admitting: Family Medicine

## 2022-02-09 ENCOUNTER — Encounter: Payer: Self-pay | Admitting: Family Medicine

## 2022-02-09 ENCOUNTER — Other Ambulatory Visit: Payer: Self-pay

## 2022-02-09 VITALS — BP 116/78 | HR 90 | Wt 145.0 lb

## 2022-02-09 DIAGNOSIS — N939 Abnormal uterine and vaginal bleeding, unspecified: Secondary | ICD-10-CM | POA: Diagnosis not present

## 2022-02-09 MED ORDER — OXYCODONE-ACETAMINOPHEN 5-325 MG PO TABS
1.0000 | ORAL_TABLET | Freq: Four times a day (QID) | ORAL | 0 refills | Status: DC | PRN
  Filled 2022-02-09: qty 1, 1d supply, fill #0

## 2022-02-09 MED ORDER — OXYCODONE-ACETAMINOPHEN 5-325 MG PO TABS: 1.0000 | ORAL_TABLET | Freq: Four times a day (QID) | ORAL | 0 refills | Status: DC | PRN

## 2022-02-09 MED ORDER — MEGESTROL ACETATE 40 MG PO TABS
80.0000 mg | ORAL_TABLET | Freq: Every day | ORAL | 2 refills | Status: DC
  Filled 2022-02-09: qty 120, 30d supply, fill #0
  Filled 2022-05-30: qty 120, 30d supply, fill #1

## 2022-02-09 NOTE — Progress Notes (Signed)
Patient states she started doing two tablets a day of the megace and has seen good improvement in her bleeding.  ?

## 2022-02-09 NOTE — Progress Notes (Signed)
? ?  Subjective:  ? ? Patient ID: Kristin Parrish, female    DOB: 1986-02-17, 36 y.o.   MRN: [id number] ? ?HPI ?Patient seen for follow-up of AUB.  Patient was having rapid cycling menses, likely anovulatory.  Laboratory work-up was negative and MRI was normal.  She had attempted COC's, which did not improve the bleeding.  She has been on Megace 80 mg daily which has improved her menses.  She now has 2 to 3 days of light bleeding every 28 to 30 days.  She has not noticed any side effects to the medication. ? ? ?Review of Systems ? ?   ?Objective:  ?BP 116/78   Pulse 90   Wt 145 lb (65.8 kg)   LMP 02/08/2022   BMI 24.89 kg/m?  ? ? Physical Exam ?Vitals reviewed.  ?Constitutional:   ?   Appearance: Normal appearance.  ?Neurological:  ?   General: No focal deficit present.  ?   Mental Status: She is alert.  ?Psychiatric:     ?   Mood and Affect: Mood normal.     ?   Behavior: Behavior normal.     ?   Thought Content: Thought content normal.     ?   Judgment: Judgment normal.  ? ? ?   ?Assessment & Plan:  ? ?1. Abnormal uterine bleeding (AUB) ?Discussed options of continuing oral medication vs placing IUD. Discussed risk of breakthrough bleeding, needing to continue oral medication, etc. Patient would like to trial. Will have patient return for placement. Percocet for procedure and will use cervical block. Continue megace until then. ? ?

## 2022-02-13 ENCOUNTER — Other Ambulatory Visit (HOSPITAL_BASED_OUTPATIENT_CLINIC_OR_DEPARTMENT_OTHER): Payer: Self-pay

## 2022-03-01 ENCOUNTER — Ambulatory Visit (INDEPENDENT_AMBULATORY_CARE_PROVIDER_SITE_OTHER): Payer: 59 | Admitting: Family Medicine

## 2022-03-01 ENCOUNTER — Encounter: Payer: Self-pay | Admitting: General Practice

## 2022-03-01 ENCOUNTER — Encounter: Payer: Self-pay | Admitting: Family Medicine

## 2022-03-01 ENCOUNTER — Other Ambulatory Visit: Payer: Self-pay

## 2022-03-01 VITALS — BP 125/80 | HR 81 | Wt 148.0 lb

## 2022-03-01 DIAGNOSIS — Z30430 Encounter for insertion of intrauterine contraceptive device: Secondary | ICD-10-CM

## 2022-03-01 LAB — POCT URINE PREGNANCY: Preg Test, Ur: NEGATIVE

## 2022-03-01 MED ORDER — LEVONORGESTREL 20.1 MCG/DAY IU IUD
1.0000 | INTRAUTERINE_SYSTEM | Freq: Once | INTRAUTERINE | Status: AC
  Administered 2022-03-01: 1 via INTRAUTERINE

## 2022-03-01 NOTE — Addendum Note (Signed)
Addended by: Mikey Bussing on: 03/01/2022 02:44 PM ? ? Modules accepted: Orders ? ?

## 2022-03-01 NOTE — Progress Notes (Signed)
IUD Procedure Note ?Patient identified, informed consent performed, signed copy in chart, time out was performed.  Urine pregnancy test negative. ? ?Speculum placed in the vagina.  Cervix visualized.  Cleaned with Betadine x 2.  Paracervical block placed with Lidocaine 2% with epinephrine 10mL spread between the 12 o'clock, 4 o'clock, 8 o'clock positions. Cervix grasped anteriorly with a single tooth tenaculum.  Uterus sounded to 9 cm.  Liletta  IUD placed per manufacturer's recommendations.  Strings trimmed to 3 cm. Tenaculum was removed, good hemostasis noted.  Patient tolerated procedure well.  ? ?Patient given post procedure instructions and Liletta care card with expiration date.  Patient is asked to check IUD strings periodically and follow up in 4-6 weeks for IUD check. ?

## 2022-04-07 ENCOUNTER — Encounter: Payer: Self-pay | Admitting: Family Medicine

## 2022-04-07 ENCOUNTER — Ambulatory Visit (INDEPENDENT_AMBULATORY_CARE_PROVIDER_SITE_OTHER): Payer: 59 | Admitting: Family Medicine

## 2022-04-07 ENCOUNTER — Other Ambulatory Visit (HOSPITAL_COMMUNITY)
Admission: RE | Admit: 2022-04-07 | Discharge: 2022-04-07 | Disposition: A | Discharge status: Home / Self Care | Payer: 59 | Source: Ambulatory Visit | Attending: Family Medicine | Admitting: Family Medicine

## 2022-04-07 VITALS — BP 114/81 | HR 82 | Wt 151.0 lb

## 2022-04-07 DIAGNOSIS — O26899 Other specified pregnancy related conditions, unspecified trimester: Secondary | ICD-10-CM | POA: Diagnosis not present

## 2022-04-07 DIAGNOSIS — N939 Abnormal uterine and vaginal bleeding, unspecified: Secondary | ICD-10-CM | POA: Diagnosis not present

## 2022-04-07 DIAGNOSIS — R102 Pelvic and perineal pain: Secondary | ICD-10-CM | POA: Diagnosis not present

## 2022-04-07 DIAGNOSIS — Z30431 Encounter for routine checking of intrauterine contraceptive device: Secondary | ICD-10-CM | POA: Insufficient documentation

## 2022-04-07 NOTE — Progress Notes (Signed)
? ?  Subjective:  ? ?Patient Name: Kristin Parrish, female   DOB: 29-Dec-1985, 36 y.o.  MRN: [id number] ? ?HPI ?Patient here for an IUD check.  She had the Liletta IUD placed 1 month ago.  She reports occasional irregular bleeding, but cramping daily. ? ? ?Review of Systems  ?Constitutional: Negative for fever and chills.  ?Gastrointestinal: Negative for abdominal pain.  ?Genitourinary: Negative for vaginal discharge, vaginal pain, pelvic pain and dyspareunia.  ? ? ?    ?Objective:  ? Physical Exam  ?Constitutional: She appears well-developed and well-nourished.  ?HENT:  ?Head: Normocephalic and atraumatic.  ?Abdominal: Soft. There is no tenderness. There is no guarding.  ?Genitourinary: There is no rash, tenderness or lesion on the right labia. There is no rash, tenderness or lesion on the left labia. No erythema or tenderness in the vagina. No foreign body around the vagina. No signs of injury around the vagina. No vaginal discharge found.  ? ? ?Skin: Skin is warm and dry.  ?Psychiatric: She has a normal mood and affect. Her behavior is normal. Judgment and thought content normal.  ? ?    ?Assessment & Plan:  ?1. IUD check up ?IUD in place. Strings trimmed.  ?Will have patient restart megace for 1 month to see if that decreases the cramping. ?If after 2 weeks no improvement, will check [place] ?Will also check cultures ? ?2. Abnormal uterine bleeding (AUB) ?improved ? ?3. Pelvic pain ? ? ?

## 2022-04-11 LAB — CERVICOVAGINAL ANCILLARY ONLY
Bacterial Vaginitis (gardnerella): NEGATIVE
Candida Glabrata: NEGATIVE
Candida Vaginitis: NEGATIVE
Chlamydia: NEGATIVE
Comment: NEGATIVE
Comment: NEGATIVE
Comment: NEGATIVE
Comment: NEGATIVE
Comment: NEGATIVE
Comment: NORMAL
Neisseria Gonorrhea: NEGATIVE
Trichomonas: NEGATIVE

## 2022-05-30 ENCOUNTER — Other Ambulatory Visit (HOSPITAL_BASED_OUTPATIENT_CLINIC_OR_DEPARTMENT_OTHER): Payer: Self-pay

## 2022-06-01 ENCOUNTER — Telehealth: Payer: Self-pay | Admitting: Family Medicine

## 2022-06-07 ENCOUNTER — Ambulatory Visit (INDEPENDENT_AMBULATORY_CARE_PROVIDER_SITE_OTHER): Payer: 59 | Admitting: Family Medicine

## 2022-06-07 ENCOUNTER — Encounter: Payer: Self-pay | Admitting: General Practice

## 2022-06-07 ENCOUNTER — Other Ambulatory Visit (HOSPITAL_BASED_OUTPATIENT_CLINIC_OR_DEPARTMENT_OTHER): Payer: Self-pay

## 2022-06-07 VITALS — BP 127/86 | HR 96

## 2022-06-07 DIAGNOSIS — Z30432 Encounter for removal of intrauterine contraceptive device: Secondary | ICD-10-CM | POA: Diagnosis not present

## 2022-06-07 DIAGNOSIS — D509 Iron deficiency anemia, unspecified: Secondary | ICD-10-CM | POA: Diagnosis not present

## 2022-06-07 DIAGNOSIS — N939 Abnormal uterine and vaginal bleeding, unspecified: Secondary | ICD-10-CM | POA: Diagnosis not present

## 2022-06-07 DIAGNOSIS — R102 Pelvic and perineal pain: Secondary | ICD-10-CM

## 2022-06-07 MED ORDER — XULANE 150-35 MCG/24HR TD PTWK
1.0000 | MEDICATED_PATCH | TRANSDERMAL | 4 refills | Status: DC
  Filled 2022-06-07: qty 9, 84d supply, fill #0

## 2022-06-07 NOTE — Progress Notes (Signed)
   Subjective:    Patient ID: Kristin Parrish, female    DOB: 11/16/1986, 36 y.o.   MRN: [id number]  HPI Patient seen for follow-up of abnormal uterine bleeding bleeding.  She initially had rapid cycling menses that was not controlled with COC's.  She was switched to Megace 80 mg daily which helped improving her bleeding.  An IUD was placed in March.  Since that time, irregular bleeding and increased cramping.  The cramping is daily and she takes Midol 2-3 times a day in order to help with the discomfort. She has also been on megace 80mg  daily to try to help with the bleeding, but she continues to have very irregular bleeding - 2-3 times a week.   Review of Systems     Objective:   Physical Exam Vitals reviewed. Exam conducted with a chaperone present.  Constitutional:      Appearance: Normal appearance.  Abdominal:     Hernia: There is no hernia in the left inguinal area or right inguinal area.  Genitourinary:    Labia:        Right: No rash, tenderness or lesion.        Left: No rash, tenderness or lesion.      Vagina: Normal.     Cervix: Normal.  Lymphadenopathy:     Lower Body: No right inguinal adenopathy. No left inguinal adenopathy.  Skin:    Capillary Refill: Capillary refill takes less than 2 seconds.  Neurological:     General: No focal deficit present.     Mental Status: She is alert.  Psychiatric:        Mood and Affect: Mood normal.        Behavior: Behavior normal.        Thought Content: Thought content normal.    IUD Removal  Patient was in the dorsal lithotomy position, normal external genitalia was noted.  A speculum was placed in the patient's vagina, normal discharge was noted, no lesions. The multiparous cervix was visualized, no lesions, no abnormal discharge,  and was swabbed with Betadine using scopettes.  The strings of the IUD was grasped and pulled using ring forceps.  The IUD was successfully removed in its entirety.  Patient tolerated the procedure  well.       Assessment & Plan:  1. Abnormal uterine bleeding (AUB) IUD removed per patient request.  We will switch to xulane patch.  We will check pelvic ultrasound.  She is considering endometrial ablation - will schedule with Gyn surgeon  2. Pelvic pain  3. Iron deficiency anemia, unspecified iron deficiency anemia type  4. Encounter for IUD removal

## 2022-06-08 ENCOUNTER — Ambulatory Visit (HOSPITAL_BASED_OUTPATIENT_CLINIC_OR_DEPARTMENT_OTHER)
Admission: RE | Admit: 2022-06-08 | Discharge: 2022-06-08 | Disposition: A | Discharge status: Home / Self Care | Payer: 59 | Source: Ambulatory Visit | Attending: Family Medicine | Admitting: Family Medicine

## 2022-06-08 ENCOUNTER — Other Ambulatory Visit (HOSPITAL_BASED_OUTPATIENT_CLINIC_OR_DEPARTMENT_OTHER): Payer: Self-pay

## 2022-06-08 ENCOUNTER — Other Ambulatory Visit: Payer: Self-pay | Admitting: Family Medicine

## 2022-06-08 ENCOUNTER — Encounter: Payer: Self-pay | Admitting: Family Medicine

## 2022-06-08 DIAGNOSIS — N939 Abnormal uterine and vaginal bleeding, unspecified: Secondary | ICD-10-CM

## 2022-06-16 ENCOUNTER — Other Ambulatory Visit (HOSPITAL_BASED_OUTPATIENT_CLINIC_OR_DEPARTMENT_OTHER): Payer: Self-pay

## 2022-06-26 ENCOUNTER — Encounter: Payer: 59 | Admitting: Family Medicine

## 2022-07-05 ENCOUNTER — Ambulatory Visit (INDEPENDENT_AMBULATORY_CARE_PROVIDER_SITE_OTHER): Payer: 59 | Admitting: Obstetrics & Gynecology

## 2022-07-05 ENCOUNTER — Encounter: Payer: Self-pay | Admitting: Obstetrics & Gynecology

## 2022-07-05 VITALS — BP 120/78 | HR 75 | Wt 159.0 lb

## 2022-07-05 DIAGNOSIS — N939 Abnormal uterine and vaginal bleeding, unspecified: Secondary | ICD-10-CM

## 2022-07-05 NOTE — Progress Notes (Signed)
History:  36 y.o. [id number] here today for f/u of AUB. She has been followed by Dr. Adrian Blackwater who has used conservative measures to manage AUB without success. Pt now has LnIUD and cont to have pain and bleeding. The bleeding is irreg and heavy. On [place] she has a thin endometrial stripe. Pt would like to proceed with endometrial ablation.   The following portions of the patient's history were reviewed and updated as appropriate: allergies, current medications, past family history, past medical history, past social history, past surgical history and problem list.  Review of Systems:  Pertinent items are noted in HPI.    Objective:  Physical Exam Blood pressure 120/78, pulse 75, weight 159 lb (72.1 kg), unknown if currently breastfeeding.  CONSTITUTIONAL: Well-developed, well-nourished female in no acute distress.  HENT:  Normocephalic, atraumatic EYES: Conjunctivae and EOM are normal. No scleral icterus.  NECK: Normal range of motion SKIN: Skin is warm and dry. No rash noted. Not diaphoretic.No pallor. NEUROLGIC: Alert and oriented to person, place, and time. Normal coordination.  Pelvic: not repeated  Labs and Imaging US PELVIC COMPLETE WITH TRANSVAGINAL  Result Date: 06/08/2022 CLINICAL DATA:  Abnormal uterine bleeding EXAM: TRANSABDOMINAL AND TRANSVAGINAL ULTRASOUND OF PELVIS TECHNIQUE: Both transabdominal and transvaginal ultrasound examinations of the pelvis were performed. Transabdominal technique was performed for global imaging of the pelvis including uterus, ovaries, adnexal regions, and pelvic cul-de-sac. It was necessary to proceed with endovaginal exam following the transabdominal exam to visualize the uterus, endometrium, and ovaries. COMPARISON:  12/19/2021 FINDINGS: Uterus Measurements: 7.3 x 4.3 x 5.1 cm = volume: 83 mL. Retroverted. Heterogeneous myometrium. No focal mass. Endometrium Thickness: 3 mm.  No endometrial fluid or mass Right ovary Measurements: 2.6 x 2.6 x 2.0 cm =  volume: 6.7 mL. Normal morphology without mass Left ovary Measurements: 2.6 x 1.3 x 2.7 cm = volume: 4.7 mL. Normal morphology without mass Other findings No free pelvic fluid.  No adnexal masses. IMPRESSION: Normal exam. Electronically Signed   By: Ulyses Southward M.D.   On: 06/08/2022 15:33    Assessment & Plan:  AUB and dysmenorrhea- possible adenomyosis.    Patient desires surgical management with endometrial ablation and IUD removal.   The risks of surgery were discussed in detail with the patient including but not limited to: increased pain requiring a hysterectomy if it is adenomyosis, bleeding which may require transfusion or reoperation; infection which may require prolonged hospitalization or re-hospitalization and antibiotic therapy; injury to bowel, bladder, ureters and major vessels or other surrounding organs; need for additional procedures including laparotomy; thromboembolic phenomenon, incisional problems and other postoperative or anesthesia complications.  Patient was told that the likelihood that her condition and symptoms will be treated effectively with this surgical management was good; the postoperative expectations were also discussed in detail. The patient also understands the alternative treatment options which were discussed in full. All questions were answered.  She was told that she will be contacted by our surgical scheduler regarding the time and date of her surgery; routine preoperative instructions of having nothing to eat or drink after midnight on the day prior to surgery and also coming to the hospital 1 1/2 hours prior to her time of surgery were also emphasized.  She was told she may be called for a preoperative appointment about a week prior to surgery and will be given further preoperative instructions at that visit. Printed patient education handouts about the procedure were given to the patient to review at home.   Total  face-to-face time with patient, review of chart,  discussion with consultant and coordination of care was .     Leanndra Pember L. Harraway-Smith, M.D., [facility]

## 2022-07-05 NOTE — Patient Instructions (Signed)
Endometrial Ablation Endometrial ablation is a procedure that destroys the thin inner layer of the lining of the uterus (endometrium). This procedure may be done: To stop heavy menstrual periods. To stop bleeding that is causing anemia. To control irregular bleeding. To treat bleeding caused by small tumors (fibroids) in the endometrium. This procedure is often done as an alternative to major surgery, such as removal of the uterus and cervix (hysterectomy). As a result of this procedure: You may not be able to have children. However, if you have not yet gone through menopause: You may still have a small chance of getting pregnant. You will need to use a reliable method of birth control after the procedure to prevent pregnancy. You may stop having a menstrual period, or you may have only a small amount of bleeding during your period. Menstruation may return several years after the procedure. Tell a health care provider about: Any allergies you have. All medicines you are taking, including vitamins, herbs, eye drops, creams, and over-the-counter medicines. Any problems you or family members have had with the use of anesthetic medicines. Any blood disorders you have. Any surgeries you have had. Any medical conditions you have. Whether you are pregnant or may be pregnant. What are the risks? Generally, this is a safe procedure. However, problems may occur, including: A hole (perforation) in the uterus or bowel. Infection in the uterus, bladder, or vagina. Bleeding. Allergic reaction to medicines. Damage to nearby structures or organs. An air bubble in the lung (air embolus). Problems with pregnancy. Failure of the procedure. Decreased ability to diagnose cancer in the endometrium. Scar tissue forms after the procedure, making it more difficult to get a sample of the uterine lining. What happens before the procedure? Medicines Ask your health care provider about: Changing or stopping your  regular medicines. This is especially important if you take diabetes medicines or blood thinners. Taking medicines such as aspirin and ibuprofen. These medicines can thin your blood. Do not take these medicines before your procedure if your doctor tells you not to take them. Taking over-the-counter medicines, vitamins, herbs, and supplements. Tests You will have tests of your endometrium to make sure there are no precancerous cells or cancer cells present. You may have an ultrasound of the uterus. General instructions Do not use any products that contain nicotine or tobacco for at least 4 weeks before the procedure. These include cigarettes, chewing tobacco, and vaping devices, such as e-cigarettes. If you need help quitting, ask your health care provider. You may be given medicines to thin the endometrium. Ask your health care provider what steps will be taken to help prevent infection. These steps may include: Removing hair at the surgery site. Washing skin with a germ-killing soap. Taking antibiotic medicine. Plan to have a responsible adult take you home from the hospital or clinic. Plan to have a responsible adult care for you for the time you are told after you leave the hospital or clinic. This is important. What happens during the procedure?  You will lie on an exam table with your feet and legs supported as in a pelvic exam. An IV will be inserted into one of your veins. You will be given a medicine to help you relax (sedative). A surgical tool with a light and camera (resectoscope) will be inserted into your vagina and moved into your uterus. This allows your surgeon to see inside your uterus. Endometrial tissue will be destroyed and removed, using one of the following methods: Radiofrequency. This   uses an electrical current to destroy the endometrium. Cryotherapy. This uses extreme cold to freeze the endometrium. Heated fluid. This uses a heated salt and water (saline) solution to  destroy the endometrium. Microwave. This uses high-energy microwaves to heat up the endometrium and destroy it. Thermal balloon. This involves inserting a catheter with a balloon tip into the uterus. The balloon tip is filled with heated fluid to destroy the endometrium. The procedure may vary among health care providers and hospitals. What happens after the procedure? Your blood pressure, heart rate, breathing rate, and blood oxygen level will be monitored until you leave the hospital or clinic. You may have vaginal bleeding for 4-6 weeks after the procedure. You may also have: Cramps. A thin, watery vaginal discharge that is light pink or brown. A need to urinate more than usual. Nausea. If you were given a sedative during the procedure, it can affect you for several hours. Do not drive or operate machinery until your health care provider says that it is safe. Do not have sex or insert anything into your vagina until your health care provider says it is safe. Summary Endometrial ablation is done to treat many causes of heavy menstrual bleeding. The procedure destroys the thin inner layer of the lining of the uterus (endometrium). This procedure is often done as an alternative to major surgery, such as removal of the uterus and cervix (hysterectomy). Plan to have a responsible adult take you home from the hospital or clinic. This information is not intended to replace advice given to you by your health care provider. Make sure you discuss any questions you have with your health care provider. Document Revised: 06/17/2020 Document Reviewed: 06/17/2020 Elsevier Patient Education  2023 [employer]. Endometrial Ablation, Care After The following information offers guidance on how to care for yourself after your procedure. Your health care provider may also give you more specific instructions. If you have problems or questions, contact your health care provider. What can I expect after the  procedure? After the procedure, it is common to have: A need to urinate more often than usual for the first 24 hours. Cramps that feel like menstrual cramps. These may last for 1-2 days. A thin, watery vaginal discharge that is light pink or brown. This may last for a few weeks. Discharge will be heavy for the first few days after your procedure. You may need to wear a sanitary pad. Nausea. Vaginal bleeding for 4-6 weeks after the procedure, as tissue healing occurs. Follow these instructions at home: Medicines  Take over-the-counter and prescription medicines only as told by your health care provider. If you were prescribed an antibiotic medicine, take it as told by your health care provider. Do not stop taking the antibiotic even if you start to feel better. Ask your health care provider if the medicine prescribed to you: Requires you to avoid driving or using machinery. Can cause constipation. You may need to take these actions to prevent or treat constipation: Drink enough fluid to keep your urine pale yellow. Take over-the-counter or prescription medicines. Eat foods that are high in fiber, such as beans, whole grains, and fresh fruits and vegetables. Limit foods that are high in fat and processed sugars, such as fried or sweet foods. Activity If you were given a sedative during the procedure, it can affect you for several hours. Do not drive or operate machinery until your health care provider says that it is safe. Do not have sex or put anything into   your vagina until your health care provider says that it is safe. Do not lift anything that is heavier than 5 lb (2.3 kg), or the limit that you are told, until your health care provider says that it is safe. Return to your normal activities as told by your health care provider. Ask your health care provider what activities are safe for you. General instructions Do not take baths, swim, or use a hot tub until your health care provider  says that it is safe. You will be able to take showers. Check your vaginal area every day for signs of infection. Check for: Redness, swelling, or more pain. More blood coming from your vagina. A bad-smelling discharge. Keep all follow-up visits. This is important. Contact a health care provider if: You have vaginal redness, swelling, or more pain. You have discharge or bleeding from your vagina that is getting worse. You have a bad-smelling vaginal discharge. You have a fever or chills. You have trouble urinating. Get help right away if: You have heavy, bright red vaginal bleeding that may include blood clots. You have severe cramps that do not get better with medicine. Summary After endometrial ablation, it is normal to have a thin, watery vaginal discharge that is light pink or brown. This may last a few weeks and may be heavier right after the procedure. Vaginal bleeding is common after the procedure and should get better with time. Check your vaginal area every day for signs of infection, such as a bad-smelling discharge. Keep all follow-up visits. This is important. This information is not intended to replace advice given to you by your health care provider. Make sure you discuss any questions you have with your health care provider. Document Revised: 06/17/2020 Document Reviewed: 06/17/2020 Elsevier Patient Education  2023 Elsevier Inc.  

## 2022-07-12 ENCOUNTER — Telehealth: Payer: Self-pay

## 2022-07-12 NOTE — Telephone Encounter (Signed)
Called patient to discuss potential surgery dates, no answer, unable to leave voicemail, phone just continues to ring. Will schedule on first available on 09/08

## 2022-07-14 ENCOUNTER — Ambulatory Visit (INDEPENDENT_AMBULATORY_CARE_PROVIDER_SITE_OTHER): Payer: 59 | Admitting: Family Medicine

## 2022-07-14 ENCOUNTER — Encounter: Payer: Self-pay | Admitting: Family Medicine

## 2022-07-14 ENCOUNTER — Other Ambulatory Visit: Payer: Self-pay | Admitting: Family Medicine

## 2022-07-14 VITALS — BP 110/70 | HR 82 | Temp 98.1°F | Ht 64.0 in | Wt 158.2 lb

## 2022-07-14 DIAGNOSIS — Z0000 Encounter for general adult medical examination without abnormal findings: Secondary | ICD-10-CM | POA: Diagnosis not present

## 2022-07-14 DIAGNOSIS — E785 Hyperlipidemia, unspecified: Secondary | ICD-10-CM

## 2022-07-14 LAB — COMPREHENSIVE METABOLIC PANEL
ALT: 30 U/L (ref 0–35)
AST: 28 U/L (ref 0–37)
Albumin: 4.6 g/dL (ref 3.5–5.2)
Alkaline Phosphatase: 94 U/L (ref 39–117)
BUN: 9 mg/dL (ref 6–23)
CO2: 28 mEq/L (ref 19–32)
Calcium: 9.3 mg/dL (ref 8.4–10.5)
Chloride: 104 mEq/L (ref 96–112)
Creatinine, Ser: 0.73 mg/dL (ref 0.40–1.20)
GFR: 105.45 mL/min (ref >60.00)
Glucose, Bld: 91 mg/dL (ref 70–99)
Potassium: 3.9 mEq/L (ref 3.5–5.1)
Sodium: 138 mEq/L (ref 135–145)
Total Bilirubin: 0.6 mg/dL (ref 0.2–1.2)
Total Protein: 7.4 g/dL (ref 6.0–8.3)

## 2022-07-14 LAB — LIPID PANEL
Cholesterol: 239 mg/dL — ABNORMAL HIGH (ref 0–200)
HDL: 60.6 mg/dL (ref >39.00)
LDL Cholesterol: 152 mg/dL — ABNORMAL HIGH (ref 0–99)
NonHDL: 178.31
Total CHOL/HDL Ratio: 4
Triglycerides: 132 mg/dL (ref 0.0–149.0)
VLDL: 26.4 mg/dL (ref 0.0–40.0)

## 2022-07-14 LAB — CBC
HCT: 39.3 % (ref 36.0–46.0)
Hemoglobin: 13.3 g/dL (ref 12.0–15.0)
MCHC: 33.8 g/dL (ref 30.0–36.0)
MCV: 89.9 fl (ref 78.0–100.0)
Platelets: 300 10*3/uL (ref 150.0–400.0)
RBC: 4.37 Mil/uL (ref 3.87–5.11)
RDW: 12.9 % (ref 11.5–15.5)
WBC: 5.4 10*3/uL (ref 4.0–10.5)

## 2022-07-14 NOTE — Patient Instructions (Signed)
Give us 2-3 business days to get the results of your labs back.   Keep the diet clean and stay active.  Please get me a copy of your advanced directive form at your convenience.   I recommend getting the flu shot in mid October. This suggestion would change if the CDC comes out with a different recommendation.   Let us know if you need anything.  

## 2022-07-14 NOTE — Progress Notes (Signed)
Chief Complaint  Patient presents with   Annual Exam     Well Woman Kristin Parrish is here for a complete physical.   Her last physical was >1 year ago.  Current diet: in general, diet is overall healthy. Current exercise: cardio, wt resistance. Contraception? Yes Fatigue out of ordinary? No Seatbelt? Yes Advanced directive? 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     ibuprofen (ADVIL) 200 MG tablet Take 200 mg by mouth every 6 (six) hours as needed.     norelgestromin-ethinyl estradiol Burr Medico) 150-35 MCG/24HR transdermal patch Place 1 patch onto the skin once a week. 9 patch 4    Allergies No Known Allergies  Review of Systems: Constitutional:  no unexpected weight changes Eye:  no recent significant change in vision Ear/Nose/Mouth/Throat:  Ears:  no tinnitus or vertigo and no recent change in hearing Nose/Mouth/Throat:  no complaints of nasal congestion, no sore throat Cardiovascular: no chest pain Respiratory:  no cough and no shortness of breath Gastrointestinal:  no abdominal pain, no change in bowel habits GU:  Female: negative for dysuria Musculoskeletal/Extremities:  no pain of the joints Integumentary (Skin/Breast):  no abnormal skin lesions reported Neurologic:  no  headaches Endocrine:  denies fatigue Hematologic/Lymphatic:  No areas of easy bleeding  Exam BP 110/70   Pulse 82   Temp 98.1 F (36.7 C) (Oral)   Ht 5\' 4"  (1.626 m)   Wt 158 lb 4 oz (71.8 kg)   LMP  (LMP Unknown)   SpO2 97%   BMI 27.16 kg/m  General:  well developed, well nourished, in no apparent distress Skin:  no significant moles, warts, or growths Head:  no masses, lesions, or tenderness Eyes:  pupils equal and round, sclera anicteric without injection Ears:  canals without lesions, TMs shiny without retraction, no obvious effusion, no erythema Nose:  nares patent, septum midline, mucosa normal, and no drainage or sinus tenderness Throat/Pharynx:  lips and gingiva without lesion; tongue and uvula midline; non-inflamed pharynx; no exudates or postnasal drainage Neck: neck supple without adenopathy, thyromegaly, or masses Lungs:  clear to auscultation, breath sounds equal bilaterally, no respiratory distress Cardio:  regular rate and rhythm, no bruits, no LE edema Abdomen:  abdomen soft, nontender; bowel sounds normal; no masses or organomegaly Genital: Defer to GYN Musculoskeletal:  symmetrical muscle groups noted without atrophy or deformity Extremities:  no clubbing, cyanosis, or edema, no deformities, no skin discoloration Neuro:  gait normal; deep tendon reflexes normal and symmetric Psych: well oriented with normal range of affect and appropriate judgment/insight  Assessment and Plan  Well adult exam - Plan: CBC, Comprehensive metabolic panel, Lipid panel   Well 36 y.o. female. Counseled on diet and exercise. Advanced directive form provided today.  Other orders as above. Follow up in 1  yr or prn. The patient voiced understanding and agreement to the plan.  Jilda Roche [place], DO 07/14/22 10:00 AM

## 2022-08-02 ENCOUNTER — Encounter (HOSPITAL_BASED_OUTPATIENT_CLINIC_OR_DEPARTMENT_OTHER): Payer: Self-pay | Admitting: Obstetrics & Gynecology

## 2022-08-03 ENCOUNTER — Encounter (HOSPITAL_BASED_OUTPATIENT_CLINIC_OR_DEPARTMENT_OTHER): Payer: Self-pay | Admitting: Obstetrics & Gynecology

## 2022-08-07 ENCOUNTER — Encounter (HOSPITAL_BASED_OUTPATIENT_CLINIC_OR_DEPARTMENT_OTHER): Payer: Self-pay | Admitting: Obstetrics & Gynecology

## 2022-08-07 ENCOUNTER — Other Ambulatory Visit: Payer: Self-pay

## 2022-08-07 NOTE — Progress Notes (Signed)
Spoke w/ via phone for pre-op interview---pt Lab needs dos---- cbc, urine preg              Lab results------ COVID test -----patient states asymptomatic no test needed Arrive at -------915 am 08-18-2022 NPO after MN NO Solid Food.  Clear liquids from MN until---815 am Med rec completed Medications to take morning of surgery -----none Diabetic medication -----n/a Patient instructed no nail polish to be worn day of surgery Patient instructed to bring photo id and insurance card day of surgery Patient aware to have Driver (ride ) / caregiver  husband Kristin Parrish    for 24 hours after surgery  Patient Special Instructions -----none Pre-Op special Istructions -----none Patient verbalized understanding of instructions that were given at this phone interview. Patient denies cardiac S & S,  shortness of breath, chest pain, fever, cough at this phone interview.   Anesthesia: history of postpartum cardiomyopathy 2009 with full recovery.  [facility] cardiology dr Lavona Mound tobb 01-17-2021 f/u prn Echo 01-17-2021 epic Ekg 01-17-2021 epic Lov pulmonary dr c young 12-07-2021 epic (mild osa no cpap needed)

## 2022-08-09 ENCOUNTER — Other Ambulatory Visit (HOSPITAL_BASED_OUTPATIENT_CLINIC_OR_DEPARTMENT_OTHER): Payer: Self-pay

## 2022-08-09 ENCOUNTER — Telehealth: Payer: 59 | Admitting: Family Medicine

## 2022-08-09 DIAGNOSIS — B001 Herpesviral vesicular dermatitis: Secondary | ICD-10-CM | POA: Diagnosis not present

## 2022-08-09 MED ORDER — VALACYCLOVIR HCL 1 G PO TABS
2000.0000 mg | ORAL_TABLET | Freq: Two times a day (BID) | ORAL | 0 refills | Status: AC
  Filled 2022-08-09: qty 4, 1d supply, fill #0

## 2022-08-09 NOTE — Progress Notes (Signed)

## 2022-08-18 ENCOUNTER — Ambulatory Visit (HOSPITAL_BASED_OUTPATIENT_CLINIC_OR_DEPARTMENT_OTHER): Payer: 59 | Admitting: Anesthesiology

## 2022-08-18 ENCOUNTER — Other Ambulatory Visit (HOSPITAL_BASED_OUTPATIENT_CLINIC_OR_DEPARTMENT_OTHER): Payer: Self-pay

## 2022-08-18 ENCOUNTER — Ambulatory Visit (HOSPITAL_BASED_OUTPATIENT_CLINIC_OR_DEPARTMENT_OTHER)
Admission: RE | Admit: 2022-08-18 | Discharge: 2022-08-18 | Disposition: A | Discharge status: Home / Self Care | Payer: 59 | Attending: Obstetrics & Gynecology | Admitting: Obstetrics & Gynecology

## 2022-08-18 ENCOUNTER — Other Ambulatory Visit: Payer: Self-pay

## 2022-08-18 ENCOUNTER — Encounter (HOSPITAL_BASED_OUTPATIENT_CLINIC_OR_DEPARTMENT_OTHER): Payer: Self-pay | Admitting: Obstetrics & Gynecology

## 2022-08-18 ENCOUNTER — Encounter (HOSPITAL_BASED_OUTPATIENT_CLINIC_OR_DEPARTMENT_OTHER)
Admission: RE | Disposition: A | Discharge status: Home / Self Care | Payer: Self-pay | Source: Home / Self Care | Attending: Obstetrics & Gynecology

## 2022-08-18 DIAGNOSIS — N711 Chronic inflammatory disease of uterus: Secondary | ICD-10-CM | POA: Diagnosis not present

## 2022-08-18 DIAGNOSIS — Z01818 Encounter for other preprocedural examination: Secondary | ICD-10-CM

## 2022-08-18 DIAGNOSIS — N939 Abnormal uterine and vaginal bleeding, unspecified: Secondary | ICD-10-CM | POA: Diagnosis not present

## 2022-08-18 DIAGNOSIS — G4733 Obstructive sleep apnea (adult) (pediatric): Secondary | ICD-10-CM | POA: Insufficient documentation

## 2022-08-18 DIAGNOSIS — Z87891 Personal history of nicotine dependence: Secondary | ICD-10-CM | POA: Diagnosis not present

## 2022-08-18 HISTORY — DX: Sleep apnea, unspecified: G47.30

## 2022-08-18 HISTORY — PX: DILITATION & CURRETTAGE/HYSTROSCOPY WITH NOVASURE ABLATION: SHX5568

## 2022-08-18 HISTORY — DX: Personal history of other diseases of the nervous system and sense organs: Z86.69

## 2022-08-18 HISTORY — DX: Presence of spectacles and contact lenses: Z97.3

## 2022-08-18 HISTORY — DX: Abnormal uterine and vaginal bleeding, unspecified: N93.9

## 2022-08-18 HISTORY — DX: Headache, unspecified: R51.9

## 2022-08-18 LAB — CBC
HCT: 40.4 % (ref 36.0–46.0)
Hemoglobin: 13.3 g/dL (ref 12.0–15.0)
MCH: 30.2 pg (ref 26.0–34.0)
MCHC: 32.9 g/dL (ref 30.0–36.0)
MCV: 91.6 fL (ref 80.0–100.0)
Platelets: 301 10*3/uL (ref 150–400)
RBC: 4.41 MIL/uL (ref 3.87–5.11)
RDW: 12.2 % (ref 11.5–15.5)
WBC: 5.9 10*3/uL (ref 4.0–10.5)
nRBC: 0 % (ref 0.0–0.2)

## 2022-08-18 LAB — POCT PREGNANCY, URINE: Preg Test, Ur: NEGATIVE

## 2022-08-18 SURGERY — DILATATION & CURETTAGE/HYSTEROSCOPY WITH NOVASURE ABLATION
Anesthesia: General

## 2022-08-18 MED ORDER — ACETAMINOPHEN 500 MG PO TABS
1000.0000 mg | ORAL_TABLET | ORAL | Status: AC
  Administered 2022-08-18: 1000 mg via ORAL

## 2022-08-18 MED ORDER — PROPOFOL 10 MG/ML IV BOLUS
INTRAVENOUS | Status: AC
  Filled 2022-08-18: qty 20

## 2022-08-18 MED ORDER — ACETAMINOPHEN 500 MG PO TABS: 1000.0000 mg | ORAL_TABLET | Freq: Once | ORAL | Status: DC

## 2022-08-18 MED ORDER — IBUPROFEN 600 MG PO TABS
600.0000 mg | ORAL_TABLET | Freq: Four times a day (QID) | ORAL | 0 refills | Status: DC | PRN
  Filled 2022-08-18: qty 20, 5d supply, fill #0

## 2022-08-18 MED ORDER — ACETAMINOPHEN 500 MG PO TABS
ORAL_TABLET | ORAL | Status: AC
  Filled 2022-08-18: qty 2

## 2022-08-18 MED ORDER — BUPIVACAINE HCL (PF) 0.5 % IJ SOLN
INTRAMUSCULAR | Status: DC | PRN
  Administered 2022-08-18: 20 mL

## 2022-08-18 MED ORDER — DEXAMETHASONE SODIUM PHOSPHATE 10 MG/ML IJ SOLN
INTRAMUSCULAR | Status: AC
  Filled 2022-08-18: qty 1

## 2022-08-18 MED ORDER — ONDANSETRON HCL 4 MG/2ML IJ SOLN
INTRAMUSCULAR | Status: DC | PRN
  Administered 2022-08-18: 4 mg via INTRAVENOUS

## 2022-08-18 MED ORDER — DEXAMETHASONE SODIUM PHOSPHATE 10 MG/ML IJ SOLN
INTRAMUSCULAR | Status: DC | PRN
  Administered 2022-08-18: 5 mg via INTRAVENOUS

## 2022-08-18 MED ORDER — FENTANYL CITRATE (PF) 100 MCG/2ML IJ SOLN: 25.0000 ug | INTRAMUSCULAR | Status: DC | PRN

## 2022-08-18 MED ORDER — ONDANSETRON HCL 4 MG/2ML IJ SOLN
INTRAMUSCULAR | Status: AC
  Filled 2022-08-18: qty 2

## 2022-08-18 MED ORDER — OXYCODONE HCL 5 MG/5ML PO SOLN: 5.0000 mg | Freq: Once | ORAL | Status: DC | PRN

## 2022-08-18 MED ORDER — FENTANYL CITRATE (PF) 100 MCG/2ML IJ SOLN
INTRAMUSCULAR | Status: AC
  Filled 2022-08-18: qty 2

## 2022-08-18 MED ORDER — KETOROLAC TROMETHAMINE 15 MG/ML IJ SOLN
15.0000 mg | INTRAMUSCULAR | Status: AC
  Administered 2022-08-18: 30 mg via INTRAVENOUS

## 2022-08-18 MED ORDER — MIDAZOLAM HCL 2 MG/2ML IJ SOLN
INTRAMUSCULAR | Status: AC
  Filled 2022-08-18: qty 2

## 2022-08-18 MED ORDER — KETOROLAC TROMETHAMINE 30 MG/ML IJ SOLN
INTRAMUSCULAR | Status: AC
  Filled 2022-08-18: qty 1

## 2022-08-18 MED ORDER — ONDANSETRON HCL 4 MG/2ML IJ SOLN: 4.0000 mg | Freq: Once | INTRAMUSCULAR | Status: DC | PRN

## 2022-08-18 MED ORDER — SOD CITRATE-CITRIC ACID 500-334 MG/5ML PO SOLN: 30.0000 mL | ORAL | Status: DC

## 2022-08-18 MED ORDER — LACTATED RINGERS IV SOLN: INTRAVENOUS | Status: DC

## 2022-08-18 MED ORDER — LIDOCAINE 2% (20 MG/ML) 5 ML SYRINGE
INTRAMUSCULAR | Status: DC | PRN
  Administered 2022-08-18: 100 mg via INTRAVENOUS

## 2022-08-18 MED ORDER — SODIUM CHLORIDE 0.9 % IR SOLN
Status: DC | PRN
  Administered 2022-08-18: 1

## 2022-08-18 MED ORDER — LIDOCAINE HCL (PF) 2 % IJ SOLN
INTRAMUSCULAR | Status: AC
  Filled 2022-08-18: qty 5

## 2022-08-18 MED ORDER — PROPOFOL 10 MG/ML IV BOLUS
INTRAVENOUS | Status: DC | PRN
  Administered 2022-08-18: 200 mg via INTRAVENOUS

## 2022-08-18 MED ORDER — AMISULPRIDE (ANTIEMETIC) 5 MG/2ML IV SOLN: 10.0000 mg | Freq: Once | INTRAVENOUS | Status: DC | PRN

## 2022-08-18 MED ORDER — MIDAZOLAM HCL 2 MG/2ML IJ SOLN
INTRAMUSCULAR | Status: DC | PRN
  Administered 2022-08-18: 2 mg via INTRAVENOUS

## 2022-08-18 MED ORDER — FENTANYL CITRATE (PF) 100 MCG/2ML IJ SOLN
INTRAMUSCULAR | Status: DC | PRN
  Administered 2022-08-18 (×2): 50 ug via INTRAVENOUS

## 2022-08-18 MED ORDER — OXYCODONE HCL 5 MG PO TABS: 5.0000 mg | ORAL_TABLET | Freq: Once | ORAL | Status: DC | PRN

## 2022-08-18 SURGICAL SUPPLY — 14 items
ABLATOR SURESOUND NOVASURE (ABLATOR) ×2 IMPLANT
CATH ROBINSON RED A/P 16FR (CATHETERS) ×1 IMPLANT
DRSG TELFA 3X8 NADH STRL (GAUZE/BANDAGES/DRESSINGS) ×1 IMPLANT
GAUZE 4X4 16PLY ~~LOC~~+RFID DBL (SPONGE) ×2 IMPLANT
GLOVE BIO SURGEON STRL SZ7 (GLOVE) ×3 IMPLANT
GLOVE BIOGEL PI IND STRL 7.0 (GLOVE) ×3 IMPLANT
GOWN STRL REUS W/TWL LRG LVL3 (GOWN DISPOSABLE) ×3 IMPLANT
IV NS IRRIG 3000ML ARTHROMATIC (IV SOLUTION) ×2 IMPLANT
KIT PROCEDURE FLUENT (KITS) ×2 IMPLANT
KIT TURNOVER CYSTO (KITS) ×2 IMPLANT
PACK VAGINAL MINOR WOMEN LF (CUSTOM PROCEDURE TRAY) ×2 IMPLANT
PAD OB MATERNITY 4.3X12.25 (PERSONAL CARE ITEMS) ×2 IMPLANT
SEAL ROD LENS SCOPE MYOSURE (ABLATOR) ×2 IMPLANT
TOWEL OR 17X26 10 PK STRL BLUE (TOWEL DISPOSABLE) ×2 IMPLANT

## 2022-08-18 NOTE — H&P (Signed)
Preoperative History and Physical  Kristin Parrish is a 36 y.o. [id number] here for surgical management of AUB with IUD in place.   Proposed surgery: Removal of intrauterine device. Hysteroscopy with endometrial ablation using Novasure.   Past Medical History:  Diagnosis Date   Abnormal uterine bleeding (AUB)    Cardiomyopathy (HCC) postpartum 2009   saw dr Lavona Mound tobb 02-06-2021 f/u prn   Cervical dysplasia 07/17/2016   Headache    histofy of ocular migraines    10 yrs ago per pt on 08-07-2022   History of blood transfusion 2009   History of COVID-19 2022   Incarcerated gravid uterus in third trimester 06/28/2017   Late prenatal care 06/20/2017   Low-lying placenta 06/20/2017   Sleep apnea    minimal osa no cpap needed per dr young pulmonary lov 12-07-2021   Tobacco abuse 06/28/2017   Wears contact lenses    Past Surgical History:  Procedure Laterality Date   APPENDECTOMY     14 yrs ago   tubal ligation Bilateral 11/2017   Filshie Clips   OB History     Gravida  4   Para  4   Term  4   Preterm      AB      Living  4      SAB      IAB      Ectopic      Multiple      Live Births  4          Patient denies any cervical dysplasia or STIs. Medications Prior to Admission  Medication Sig Dispense Refill Last Dose   Acetaminophen-Caff-Pyrilamine (MIDOL COMPLETE PO) Take by mouth.   Past Week   famotidine (PEPCID) 20 MG tablet Take 20 mg by mouth as needed for heartburn or indigestion.   08/17/2022   ibuprofen (ADVIL) 200 MG tablet Take 200 mg by mouth every 6 (six) hours as needed.   Past Week    Allergies  Allergen Reactions   Amoxicillin     Childhood allergy   Social History:   reports that she quit smoking about 20 months ago. Her smoking use included cigarettes. She has a 3.75 pack-year smoking history. She uses smokeless tobacco. She reports current alcohol use. She reports that she does not use drugs. Family History  Problem Relation Age of Onset    Heart Problems Paternal Grandfather     Review of Systems: Noncontributory  PHYSICAL EXAM: Blood pressure 117/85, pulse 64, temperature 97.8 F (36.6 C), temperature source Oral, resp. rate 18, height 5\' 4"  (1.626 m), weight 72.2 kg, last menstrual period 07/05/2022, SpO2 100 %, unknown if currently breastfeeding. General appearance - alert, well appearing, and in no distress Chest - clear to auscultation, no wheezes, rales or rhonchi, symmetric air entry Heart - normal rate and regular rhythm Abdomen - soft, nontender, nondistended, no masses or organomegaly Pelvic - examination not indicated Extremities - peripheral pulses normal, no pedal edema, no clubbing or cyanosis  Labs: Results for orders placed or performed during the hospital encounter of 08/18/22 (from the past 336 hour(s))  Pregnancy, urine POC   Collection Time: 08/18/22  9:21 AM  Result Value Ref Range   Preg Test, Ur NEGATIVE NEGATIVE  CBC   Collection Time: 08/18/22  9:32 AM  Result Value Ref Range   WBC 5.9 4.0 - 10.5 K/uL   RBC 4.41 3.87 - 5.11 MIL/uL   Hemoglobin 13.3 12.0 - 15.0 g/dL   HCT 10/18/22 92.1 -  46.0 %   MCV 91.6 80.0 - 100.0 fL   MCH 30.2 26.0 - 34.0 pg   MCHC 32.9 30.0 - 36.0 g/dL   RDW 51.7 61.6 - 07.3 %   Platelets 301 150 - 400 K/uL   nRBC 0.0 0.0 - 0.2 %    Imaging Studies: 06/08/2022 CLINICAL DATA:  Abnormal uterine bleeding   EXAM: TRANSABDOMINAL AND TRANSVAGINAL ULTRASOUND OF PELVIS   TECHNIQUE: Both transabdominal and transvaginal ultrasound examinations of the pelvis were performed. Transabdominal technique was performed for global imaging of the pelvis including uterus, ovaries, adnexal regions, and pelvic cul-de-sac. It was necessary to proceed with endovaginal exam following the transabdominal exam to visualize the uterus, endometrium, and ovaries.   COMPARISON:  12/19/2021   FINDINGS: Uterus   Measurements: 7.3 x 4.3 x 5.1 cm = volume: 83 mL. Retroverted. Heterogeneous  myometrium. No focal mass.   Endometrium   Thickness: 3 mm.  No endometrial fluid or mass   Right ovary   Measurements: 2.6 x 2.6 x 2.0 cm = volume: 6.7 mL. Normal morphology without mass   Left ovary   Measurements: 2.6 x 1.3 x 2.7 cm = volume: 4.7 mL. Normal morphology without mass   Other findings   No free pelvic fluid.  No adnexal masses.   IMPRESSION: Normal exam.      Assessment: Patient Active Problem List   Diagnosis Date Noted   Iron deficiency anemia 01/17/2021   Metabolic syndrome 01/17/2021   COVID-19 01/17/2021   OSA (obstructive sleep apnea) 01/17/2021   Postpartum cardiomyopathy 06/28/2017   Tobacco user 06/28/2017   Late prenatal care 06/20/2017   Cervical dysplasia 07/17/2016    Plan: Patient will undergo surgical management with Removal of intrauterine device. Hysteroscopy with endometrial ablation using Novasure.  The risks of surgery were discussed in detail with the patient including but not limited to: bleeding which may require transfusion or reoperation; infection which may require antibiotics; injury to surrounding organs which may involve bowel, bladder, ureters ; need for additional procedures including laparoscopy or laparotomy; thromboembolic phenomenon, surgical site problems and other postoperative/anesthesia complications. Likelihood of success in alleviating the patient's condition was discussed. Routine postoperative instructions will be reviewed with the patient and her family in detail after surgery.  The patient concurred with the proposed plan, giving informed written consent for the surgery.  Patient has been NPO since last night she will remain NPO for procedure.  Anesthesia and OR aware.  Preoperative prophylactic antibiotics and SCDs ordered on call to the OR.  To OR when ready.  Hanae Waiters L. Harraway-Smith, M.D., [facility] 08/18/2022 10:14 AM

## 2022-08-18 NOTE — Anesthesia Preprocedure Evaluation (Signed)
Anesthesia Evaluation  Patient identified by MRN, date of birth, ID band Patient awake    Reviewed: Allergy & Precautions, NPO status , Patient's Chart, lab work & pertinent test results  History of Anesthesia Complications Negative for: history of anesthetic complications  Airway Mallampati: II  TM Distance: >3 FB Neck ROM: Full    Dental  (+) Dental Advisory Given   Pulmonary sleep apnea , former smoker,    Pulmonary exam normal        Cardiovascular negative cardio ROS Normal cardiovascular exam  H/o PPM cardiomyopathy  Echo 01/17/21: EF 55-60%, no RWMA, normal DF, normal RVSF, mild MS   Neuro/Psych negative neurological ROS     GI/Hepatic negative GI ROS, Neg liver ROS,   Endo/Other  negative endocrine ROS  Renal/GU negative Renal ROS  negative genitourinary   Musculoskeletal negative musculoskeletal ROS (+)   Abdominal   Peds  Hematology negative hematology ROS (+)   Anesthesia Other Findings   Reproductive/Obstetrics AUB                             Anesthesia Physical Anesthesia Plan  ASA: 2  Anesthesia Plan: General   Post-op Pain Management: Tylenol PO (pre-op)* and Toradol IV (intra-op)*   Induction: Intravenous  PONV Risk Score and Plan: 3 and Ondansetron, Dexamethasone, Midazolam and Treatment may vary due to age or medical condition  Airway Management Planned: LMA  Additional Equipment: None  Intra-op Plan:   Post-operative Plan: Extubation in OR  Informed Consent: I have reviewed the patients History and Physical, chart, labs and discussed the procedure including the risks, benefits and alternatives for the proposed anesthesia with the patient or authorized representative who has indicated his/her understanding and acceptance.     Dental advisory given  Plan Discussed with:   Anesthesia Plan Comments:         Anesthesia Quick Evaluation

## 2022-08-18 NOTE — Op Note (Signed)
08/18/2022  12:14 PM  PATIENT:  Kristin Parrish  36 y.o. female  PRE-OPERATIVE DIAGNOSIS:  AUB  POST-OPERATIVE DIAGNOSIS:  abnormal uterine bleeding  PROCEDURE:  Procedure(s): DILATATION & CURETTAGE/HYSTEROSCOPY WITH NOVASURE ABLATION WITH ENDOMETRIAL CURETTINGS  SURGEON:  Surgeon(s) and Role:    * Willodean Rosenthal, MD - Primary  PHYSICIAN ASSISTANT:   ANESTHESIA:   general and paracervical block  EBL:  5 mL   BLOOD ADMINISTERED:none  DRAINS: none   LOCAL MEDICATIONS USED:  MARCAINE     SPECIMEN:  Source of Specimen:  endometrial curettings.  DISPOSITION OF SPECIMEN:  PATHOLOGY  COUNTS:  yes  TOURNIQUET:  * No tourniquets in log *  DICTATION: .Note written in EPIC  PLAN OF CARE: Discharge to home after PACU  PATIENT DISPOSITION:  PACU - hemodynamically stable.   Delay start of Pharmacological VTE agent (>24hrs) due to surgical blood loss or risk of bleeding: not applicable  Complications: none immediate   Indications: Pt with continued heavy bleeding despite conservative measures. She has a LnIUD but, conutinued to have bleeding. The IUD was removed in the office.     The risks, benefits, and alternatives of surgery were explained, understood, and accepted. The consents were signed and all questions were answered. She was taken to the operating room and general anesthesia was applied without complication. She was placed in the dorsal lithotomy position and her vagina and abdomen were prepped and draped in the usual sterile fashion. A bimanual exam revealed a normal size and shape anteverted mobile uterus. Her adnexa were non-enlarged.   A bivalved speculum was placed in the patients' vagina and the anterior lip of the cervix was grasped with a single toothed tenaculum. A paracervical block was performed at 5 and 7 o'clock with 20cc of 0.5% Marcaine.   The endometrial cavity was sounded to 10.5cm and the endocervical length measured 3.5cm. A hysteroscope was  inserted and the endometrium was noted to be thickened with several polyps.  The ostia on both sides were noted.  The scope was removed and a sharp currete was used to scape the lining of the uterus until a gritty texture was noted throughout.  Specimens were sent to pathology.  The NovaSure device was then inserted and seated using 6.5cm as the cavity length and 3.8cm as the cavity width.  The total activation time was 49 sec at a power of 136watts.  The hysteroscope was reinserted and an even burn pattern was noted to the fundus.  The single toothed tenaculum was removed at the end of the case and no bleeding was noted from the cervix.   The patient was extubated and taken to the recovery room in stable condition.  Sponge, lap and instrument counts were correct.  There were no noted complications.  Kristin Parrish, M.D., [facility]

## 2022-08-18 NOTE — Transfer of Care (Signed)
Immediate Anesthesia Transfer of Care Note  Patient: Kristin Parrish  Procedure(s) Performed: Procedure(s): DILATATION & CURETTAGE/HYSTEROSCOPY WITH NOVASURE ABLATION WITH ENDOMETRIAL CURETTINGS  Patient Location: PACU  Anesthesia Type: General  Level of Consciousness: awake, alert  and oriented  Airway & Oxygen Therapy: Patient Spontanous Breathing   Post-op Assessment: Report given to PACU RN and Post -op Vital signs reviewed and stable  Post vital signs: Reviewed and stable  Complications: No apparent anesthesia complications Last Vitals:  Vitals Value Taken Time  BP 124/83 08/18/22 1200  Temp 36.6 C 08/18/22 1157  Pulse 79 08/18/22 1202  Resp 10 08/18/22 1202  SpO2 100 % 08/18/22 1202  Vitals shown include unvalidated device data.  Last Pain:  Vitals:   08/18/22 1157  TempSrc:   PainSc: 0-No pain      Patients Stated Pain Goal: 5 (08/18/22 0911)  Complications: No notable events documented.

## 2022-08-18 NOTE — Anesthesia Postprocedure Evaluation (Signed)
Anesthesia Post Note  Patient: Tarry Blayney  Procedure(s) Performed: DILATATION & CURETTAGE/HYSTEROSCOPY WITH NOVASURE ABLATION WITH ENDOMETRIAL CURETTINGS     Patient location during evaluation: PACU Anesthesia Type: General Level of consciousness: awake and alert Pain management: pain level controlled Vital Signs Assessment: post-procedure vital signs reviewed and stable Respiratory status: spontaneous breathing, nonlabored ventilation and respiratory function stable Cardiovascular status: blood pressure returned to baseline and stable Postop Assessment: no apparent nausea or vomiting Anesthetic complications: no   No notable events documented.  Last Vitals:  Vitals:   08/18/22 1225 08/18/22 1245  BP: 124/78 129/80  Pulse: 71 66  Resp: 17 15  Temp: 37.1 C 37.1 C  SpO2: 99% 100%    Last Pain:  Vitals:   08/18/22 1245  TempSrc:   PainSc: 1                  Lucretia Kern

## 2022-08-18 NOTE — Discharge Instructions (Signed)
No acetaminophen/Tylenol until after 3:15pm today if needed for pain.    No ibuprofen, Advil, Aleve, Motrin, ketorolac, meloxicam, naproxen, or other NSAIDS until after 5:48pm today if needed for pain.       DISCHARGE INSTRUCTIONS: HYSTEROSCOPY / ENDOMETRIAL ABLATION The following instructions have been prepared to help you care for yourself upon your return home.   Personal hygiene:  Use sanitary pads for vaginal drainage, not tampons.  Shower the day after your procedure.  NO tub baths, pools or Jacuzzis for 2-3 weeks.  Wipe front to back after using the bathroom.  Activity and limitations:  Do NOT drive or operate any equipment for 24 hours. The effects of anesthesia are still present and drowsiness may result.  Do NOT rest in bed all day.  Walking is encouraged.  Walk up and down stairs slowly.  You may resume your normal activity in one to two days or as indicated by your physician. Sexual activity: NO intercourse for at least 2 weeks after the procedure, or as indicated by your Doctor.  Diet: Eat a light meal as desired this evening. You may resume your usual diet tomorrow.  Return to Work: You may resume your work activities in one to two days or as indicated by Therapist, sports.  What to expect after your surgery: Expect to have vaginal bleeding/discharge for 2-3 days and spotting for up to 10 days. It is not unusual to have soreness for up to 1-2 weeks. You may have a slight burning sensation when you urinate for the first day. Mild cramps may continue for a couple of days. You may have a regular period in 2-6 weeks.  Call your doctor for any of the following:  Excessive vaginal bleeding or clotting, saturating and changing one pad every hour.  Inability to urinate 6 hours after discharge from hospital.  Pain not relieved by pain medication.  Fever of 100.4 F or greater.  Unusual vaginal discharge or odor.    Post Anesthesia Home Care  Instructions  Activity: Get plenty of rest for the remainder of the day. A responsible individual must stay with you for 24 hours following the procedure.  For the next 24 hours, DO NOT: -Drive a car -Advertising copywriter -Drink alcoholic beverages -Take any medication unless instructed by your physician -Make any legal decisions or sign important papers.  Meals: Start with liquid foods such as gelatin or soup. Progress to regular foods as tolerated. Avoid greasy, spicy, heavy foods. If nausea and/or vomiting occur, drink only clear liquids until the nausea and/or vomiting subsides. Call your physician if vomiting continues.  Special Instructions/Symptoms: Your throat may feel dry or sore from the anesthesia or the breathing tube placed in your throat during surgery. If this causes discomfort, gargle with warm salt water. The discomfort should disappear within 24 hours.

## 2022-08-18 NOTE — Brief Op Note (Signed)
08/18/2022  12:14 PM  PATIENT:  Kristin Parrish  36 y.o. female  PRE-OPERATIVE DIAGNOSIS:  AUB  POST-OPERATIVE DIAGNOSIS:  abnormal uterine bleeding  PROCEDURE:  Procedure(s): DILATATION & CURETTAGE/HYSTEROSCOPY WITH NOVASURE ABLATION WITH ENDOMETRIAL CURETTINGS  SURGEON:  Surgeon(s) and Role:    * Willodean Rosenthal, MD - Primary  PHYSICIAN ASSISTANT:   ANESTHESIA:   general and paracervical block  EBL:  5 mL   BLOOD ADMINISTERED:none  DRAINS: none   LOCAL MEDICATIONS USED:  MARCAINE     SPECIMEN:  Source of Specimen:  endometrial curettings.  DISPOSITION OF SPECIMEN:  PATHOLOGY  COUNTS:  yes  TOURNIQUET:  * No tourniquets in log *  DICTATION: .Note written in EPIC  PLAN OF CARE: Discharge to home after PACU  PATIENT DISPOSITION:  PACU - hemodynamically stable.   Delay start of Pharmacological VTE agent (>24hrs) due to surgical blood loss or risk of bleeding: not applicable  Complications: none immediate   Daisuke Bailey L. Harraway-Smith, M.D., [facility]

## 2022-08-18 NOTE — Anesthesia Procedure Notes (Signed)
Procedure Name: LMA Insertion Date/Time: 08/18/2022 11:19 AM  Performed by: Norva Pavlov, CRNAPre-anesthesia Checklist: Patient identified, Emergency Drugs available, Suction available and Patient being monitored Patient Re-evaluated:Patient Re-evaluated prior to induction Oxygen Delivery Method: Circle system utilized Preoxygenation: Pre-oxygenation with 100% oxygen Induction Type: IV induction Ventilation: Mask ventilation without difficulty LMA: LMA inserted LMA Size: 4.0 Number of attempts: 1 Airway Equipment and Method: Bite block Placement Confirmation: positive ETCO2 Tube secured with: Tape Dental Injury: Teeth and Oropharynx as per pre-operative assessment

## 2022-08-21 ENCOUNTER — Encounter (HOSPITAL_BASED_OUTPATIENT_CLINIC_OR_DEPARTMENT_OTHER): Payer: Self-pay | Admitting: Obstetrics & Gynecology

## 2022-08-22 LAB — SURGICAL PATHOLOGY

## 2022-08-23 ENCOUNTER — Telehealth: Payer: Self-pay

## 2022-08-23 ENCOUNTER — Other Ambulatory Visit: Payer: Self-pay | Admitting: Obstetrics & Gynecology

## 2022-08-23 DIAGNOSIS — N711 Chronic inflammatory disease of uterus: Secondary | ICD-10-CM

## 2022-08-23 MED ORDER — DOXYCYCLINE HYCLATE 100 MG PO CAPS: 100.0000 mg | ORAL_CAPSULE | Freq: Two times a day (BID) | ORAL | 0 refills | Status: AC

## 2022-08-23 NOTE — Progress Notes (Signed)
Benign endometrial pathology but showed chronic endometritis which could cause abnormal uterine bleeding. Treatment with Doxycycline is recommended, this was prescribed for patient.  Please call to inform patient of results and recommendations.  Jaynie Collins, MD

## 2022-08-23 NOTE — Telephone Encounter (Signed)
-----   Message from Tereso Newcomer, MD sent at 08/23/2022  3:01 PM EDT ----- Benign endometrial pathology but showed chronic endometritis which could cause abnormal uterine bleeding. Treatment with Doxycycline is recommended, this was prescribed for patient.  Please call to inform patient of results and recommendations.  Jaynie Collins, MD

## 2022-08-23 NOTE — Telephone Encounter (Signed)
Attempted to reach patient by phone. Unable to leave message.   Sending my chart message to patient. Armandina Stammer RN

## 2022-08-24 ENCOUNTER — Other Ambulatory Visit (HOSPITAL_BASED_OUTPATIENT_CLINIC_OR_DEPARTMENT_OTHER): Payer: Self-pay

## 2022-08-29 DIAGNOSIS — M791 Myalgia, unspecified site: Secondary | ICD-10-CM | POA: Diagnosis not present

## 2022-08-29 DIAGNOSIS — R051 Acute cough: Secondary | ICD-10-CM | POA: Diagnosis not present

## 2022-08-29 DIAGNOSIS — R0981 Nasal congestion: Secondary | ICD-10-CM | POA: Diagnosis not present

## 2022-08-29 DIAGNOSIS — R509 Fever, unspecified: Secondary | ICD-10-CM | POA: Diagnosis not present

## 2022-09-08 ENCOUNTER — Other Ambulatory Visit (INDEPENDENT_AMBULATORY_CARE_PROVIDER_SITE_OTHER): Payer: 59

## 2022-09-08 ENCOUNTER — Other Ambulatory Visit: Payer: Self-pay | Admitting: Family Medicine

## 2022-09-08 DIAGNOSIS — E785 Hyperlipidemia, unspecified: Secondary | ICD-10-CM

## 2022-09-08 LAB — LIPID PANEL
Cholesterol: 197 mg/dL (ref 0–200)
HDL: 45.4 mg/dL (ref >39.00)
LDL Cholesterol: 116 mg/dL — ABNORMAL HIGH (ref 0–99)
NonHDL: 151.63
Total CHOL/HDL Ratio: 4
Triglycerides: 178 mg/dL — ABNORMAL HIGH (ref 0.0–149.0)
VLDL: 35.6 mg/dL (ref 0.0–40.0)

## 2022-09-27 ENCOUNTER — Ambulatory Visit (INDEPENDENT_AMBULATORY_CARE_PROVIDER_SITE_OTHER): Payer: 59 | Admitting: Obstetrics and Gynecology

## 2022-09-27 ENCOUNTER — Encounter: Payer: Self-pay | Admitting: Obstetrics and Gynecology

## 2022-09-27 VITALS — BP 116/71 | HR 73 | Wt 162.0 lb

## 2022-09-27 DIAGNOSIS — Z09 Encounter for follow-up examination after completed treatment for conditions other than malignant neoplasm: Secondary | ICD-10-CM

## 2022-09-27 NOTE — Progress Notes (Signed)
Patient presents for post op for D&C with Novasure ablation- patient denies any bleeding.Kathrene Alu RN

## 2022-09-27 NOTE — Progress Notes (Signed)
   POSTOPERATIVE VIST  Patient ID: Kristin Parrish, female   DOB: [birth year], 36 y.o.   MRN: [id number] History:  36 y.o. here today for 6 week post op check. Pt is s/p DILATATION & CURETTAGE/HYSTEROSCOPY WITH NOVASURE ABLATION WITH ENDOMETRIAL CURETTINGS Pt reports that she is doing well. She is eating, voiding, passing flatus and stool, ambulating, and overall doing well.  Initially had some discharge, but that has resolved.  No vaginal bleeding since procedure.  She has had intercourse since procedure without issue.  Completed doxycycline course for endometritis.  She has not had any menstrual cramping.     The following portions of the patient's history were reviewed and updated as appropriate: allergies, current medications, past family history, past medical history, past social history, past surgical history and problem list.  Review of Systems:  Pertinent items are noted in HPI.    Objective:  Physical Exam Vitals:   09/27/22 1420  BP: 116/71  Pulse: 73    CONSTITUTIONAL: Well-developed, well-nourished female in no acute distress.  HENT:  Normocephalic, atraumatic EYES: Conjunctivae and EOM are normal. No scleral icterus.  NECK: Normal range of motion SKIN: Skin is warm and dry. No rash noted. Not diaphoretic.No pallor. [place]: Alert and oriented to person, place, and time. Normal coordination.  Abd: Soft, nontender and nondistended; her port sites are healing well. .  Pelvic: deferred  Labs and Imaging Surg path PATHOLOGY SURGICAL PATHOLOGY  CASE: [id number]  PATIENT: Kristin Parrish  Surgical Pathology Report      Clinical History: AUB (crm)      FINAL MICROSCOPIC DIAGNOSIS:   A. ENDOMETRIUM, CURETTAGE:  - Chronic endometritis, see comment  - Benign proliferative endometrium  - Negative for hyperplasia or malignancy      Assessment & Plan:  Post op check following hysteroscopy, D&C, and novasure ablation   Doing well  Reviewed her surg path.    Reviewed post op instructions and activities Reviewed postprocedures expectations including, but not limited, possible return of bleeding and/or cramping  F/u prn otherwise return for annual  All questions answered.    Darliss Cheney, MD Minimally Invasive Gynecologic Surgery 09/27/22

## 2022-11-05 ENCOUNTER — Telehealth: Payer: 59 | Admitting: Family Medicine

## 2022-11-05 DIAGNOSIS — B001 Herpesviral vesicular dermatitis: Secondary | ICD-10-CM | POA: Diagnosis not present

## 2022-11-05 MED ORDER — VALACYCLOVIR HCL 1 G PO TABS: 2000.0000 mg | ORAL_TABLET | Freq: Two times a day (BID) | ORAL | 0 refills | Status: AC

## 2022-11-05 NOTE — Progress Notes (Signed)
We are sorry that you are not feeling well.  Here is how we plan to help!  Based on what you have shared with me it does look like you have a viral infection.    Most cold sores or fever blisters are small fluid filled blisters around the mouth caused by herpes simplex virus.  The most common strain of the virus causing cold sores is herpes simplex virus 1.  It can be spread by skin contact, sharing eating utensils, or even sharing towels.  Cold sores are contagious to other people until dry. (Approximately 5-7 days).  Wash your hands. You can spread the virus to your eyes through handling your contact lenses after touching the lesions.  Most people experience pain at the sight or tingling sensations in their lips that may begin before the ulcers erupt.  Herpes simplex is treatable but not curable.  It may lie dormant for a long time and then reappear due to stress or prolonged sun exposure.  Many patients have success in treating their cold sores with an over the counter topical called Abreva.  You may apply the cream up to 5 times daily (maximum 10 days) until healing occurs.  If you would like to use an oral antiviral medication to speed the healing of your cold sore, I have sent a prescription to your local pharmacy Valacyclovir 2 gm take one by mouth twice a day for 1 day    HOME CARE:  Wash your hands frequently. Do not pick at or rub the sore. Don't open the blisters. Avoid kissing other people during this time. Avoid sharing drinking glasses, eating utensils, or razors. Do not handle contact lenses unless you have thoroughly washed your hands with soap and warm water! Avoid oral sex during this time.  Herpes from sores on your mouth can spread to your partner's genital area. Avoid contact with anyone who has eczema or a weakened immune system. Cold sores are often triggered by exposure to intense sunlight, use a lip balm containing a sunscreen (SPF 30 or higher).  GET HELP RIGHT AWAY  IF:  Blisters look infected. Blisters occur near or in the eye. Symptoms last longer than 10 days. Your symptoms become worse.  MAKE SURE YOU:  Understand these instructions. Will watch your condition. Will get help right away if you are not doing well or get worse.    Your e-visit answers were reviewed by a board certified advanced clinical practitioner to complete your personal care plan.  Depending upon the condition, your plan could have  Included both over the counter or prescription medications.    Please review your pharmacy choice.  Be sure that the pharmacy you have chosen is open so that you can pick up your prescription now.  If there is a problem you can message your provider in MyChart to have the prescription routed to another pharmacy.    Your safety is important to [place].  If you have drug allergies check our prescription carefully.  For the next 24 hours you can use MyChart to ask questions about today's visit, request a non-urgent call back, or ask for a work or school excuse from your e-visit provider.  You will get an email in the next two days asking about your experience.  I hope that your e-visit has been valuable and will speed your recovery.   I have provided 5 minutes of non face to face time during this encounter for chart review and documentation.

## 2022-12-08 ENCOUNTER — Other Ambulatory Visit: Payer: 59

## 2023-01-22 ENCOUNTER — Telehealth: Payer: 59 | Admitting: Physician Assistant

## 2023-01-22 DIAGNOSIS — B001 Herpesviral vesicular dermatitis: Secondary | ICD-10-CM

## 2023-01-22 MED ORDER — VALACYCLOVIR HCL 1 G PO TABS: 2000.0000 mg | ORAL_TABLET | Freq: Two times a day (BID) | ORAL | 0 refills | Status: AC

## 2023-01-22 NOTE — Progress Notes (Signed)
We are sorry that you are not feeling well.  Here is how we plan to help!  Based on what you have shared with me it does look like you have a viral infection.    Most cold sores or fever blisters are small fluid filled blisters around the mouth caused by herpes simplex virus.  The most common strain of the virus causing cold sores is herpes simplex virus 1.  It can be spread by skin contact, sharing eating utensils, or even sharing towels.  Cold sores are contagious to other people until dry. (Approximately 5-7 days).  Wash your hands. You can spread the virus to your eyes through handling your contact lenses after touching the lesions.  Most people experience pain at the sight or tingling sensations in their lips that may begin before the ulcers erupt.  Herpes simplex is treatable but not curable.  It may lie dormant for a long time and then reappear due to stress or prolonged sun exposure.  Many patients have success in treating their cold sores with an over the counter topical called Abreva.  You may apply the cream up to 5 times daily (maximum 10 days) until healing occurs.  If you would like to use an oral antiviral medication to speed the healing of your cold sore, I have sent a prescription to your local pharmacy Valacyclovir 2 gm take one by mouth twice a day for 1 day    HOME CARE:  Wash your hands frequently. Do not pick at or rub the sore. Don't open the blisters. Avoid kissing other people during this time. Avoid sharing drinking glasses, eating utensils, or razors. Do not handle contact lenses unless you have thoroughly washed your hands with soap and warm water! Avoid oral sex during this time.  Herpes from sores on your mouth can spread to your partner's genital area. Avoid contact with anyone who has eczema or a weakened immune system. Cold sores are often triggered by exposure to intense sunlight, use a lip balm containing a sunscreen (SPF 30 or higher).  GET HELP RIGHT AWAY  IF:  Blisters look infected. Blisters occur near or in the eye. Symptoms last longer than 10 days. Your symptoms become worse.  MAKE SURE YOU:  Understand these instructions. Will watch your condition. Will get help right away if you are not doing well or get worse.    Your e-visit answers were reviewed by a board certified advanced clinical practitioner to complete your personal care plan.  Depending upon the condition, your plan could have  Included both over the counter or prescription medications.    Please review your pharmacy choice.  Be sure that the pharmacy you have chosen is open so that you can pick up your prescription now.  If there is a problem you can message your provider in [place] to have the prescription routed to another pharmacy.    Your safety is important to [place].  If you have drug allergies check our prescription carefully.  For the next 24 hours you can use MyChart to ask questions about today's visit, request a non-urgent call back, or ask for a work or school excuse from your e-visit provider.  You will get an email in the next two days asking about your experience.  I hope that your e-visit has been valuable and will speed your recovery.  I have spent 5 minutes in review of e-visit questionnaire, review and updating patient chart, medical decision making and response to patient.  Mar Daring, PA-C

## 2023-03-23 IMAGING — US US PELVIS COMPLETE WITH TRANSVAGINAL
1 series · 13 of 25 positions shown · non-contrast
Comparison: None

CLINICAL DATA: Abnormal uterine bleeding, LMP 12/12/2021

EXAM:
TRANSABDOMINAL AND TRANSVAGINAL ULTRASOUND OF PELVIS
TECHNIQUE: Both transabdominal and transvaginal ultrasound examinations of the
pelvis were performed. Transabdominal technique was performed for
global imaging of the pelvis including uterus, ovaries, adnexal
regions, and pelvic cul-de-sac. It was necessary to proceed with
endovaginal exam following the transabdominal exam to visualize the
endometrium and LEFT ovary.

[Series 1: us pelvis complete with transvaginal · 13 of 102 slices shown]
[im 1/102]
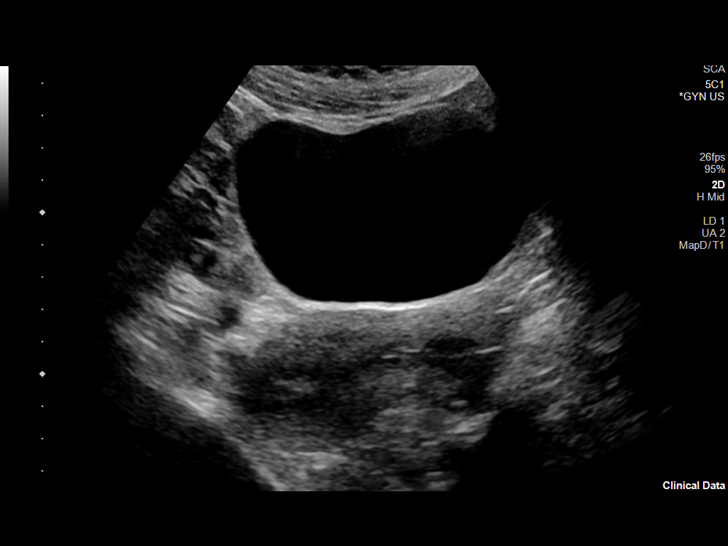
[im 9/102]
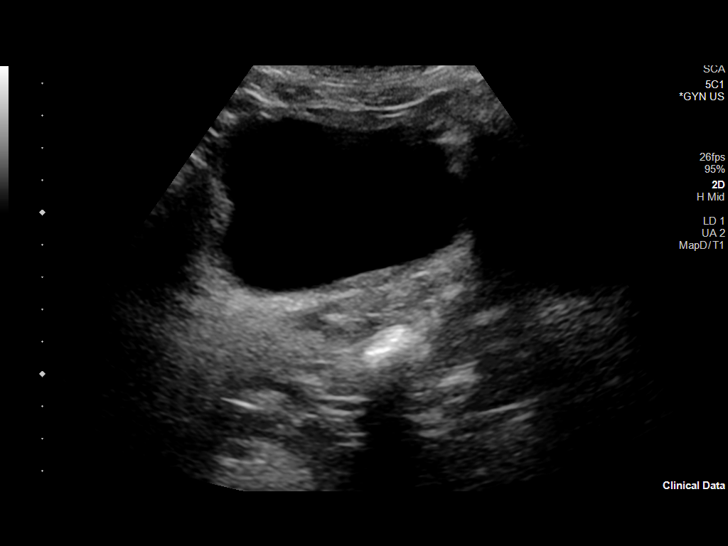
[im 17/102]
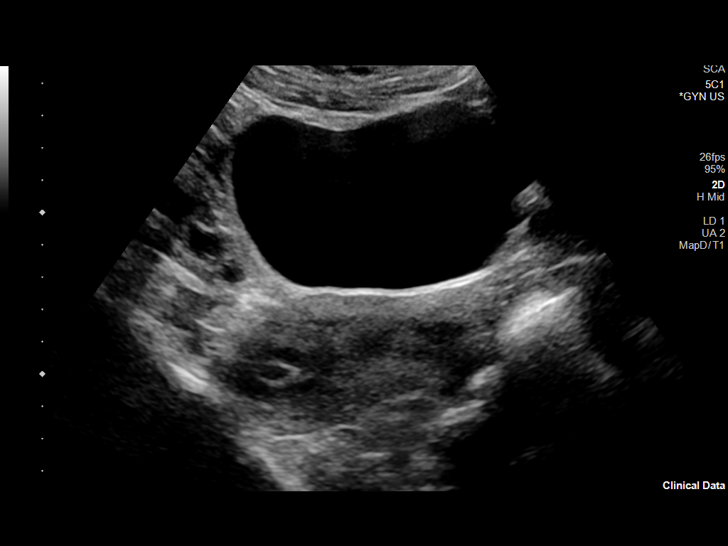
[im 26/102]
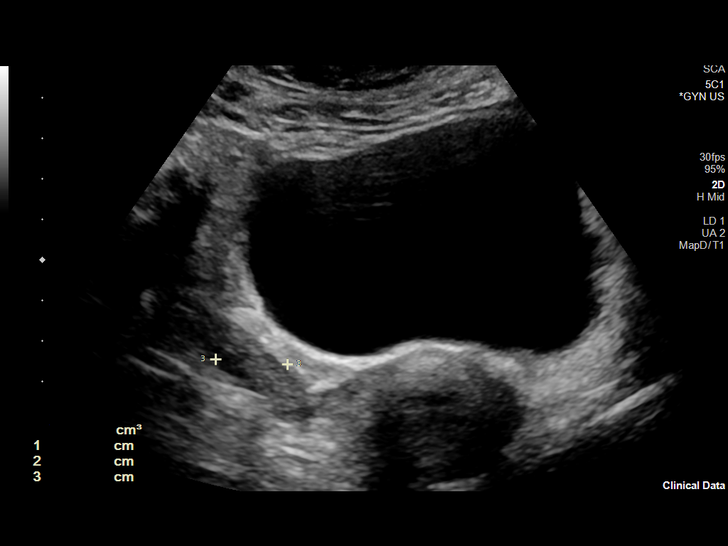
[im 34/102]
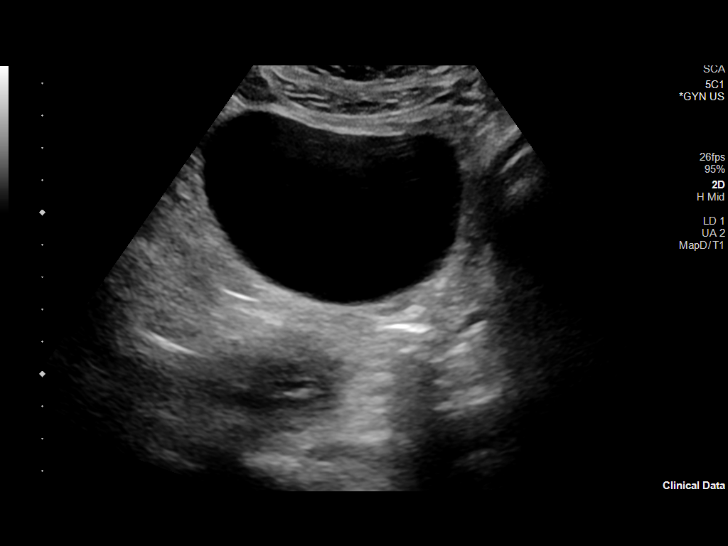
[im 43/102]
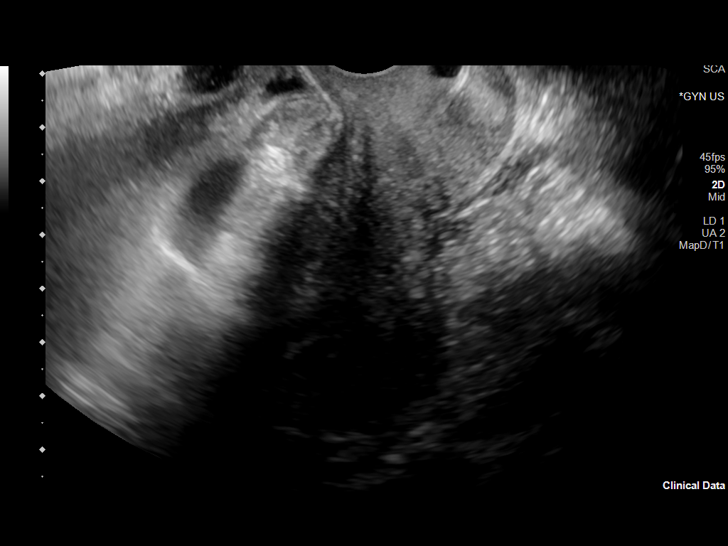
[im 51/102]
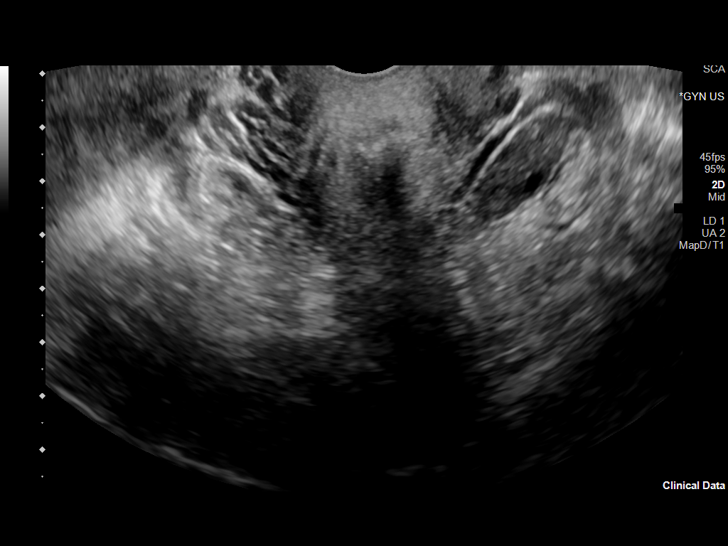
[im 59/102]
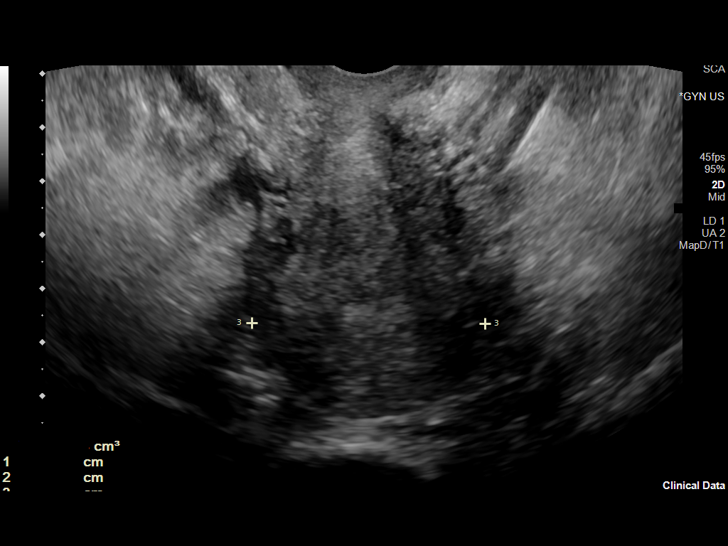
[im 68/102]
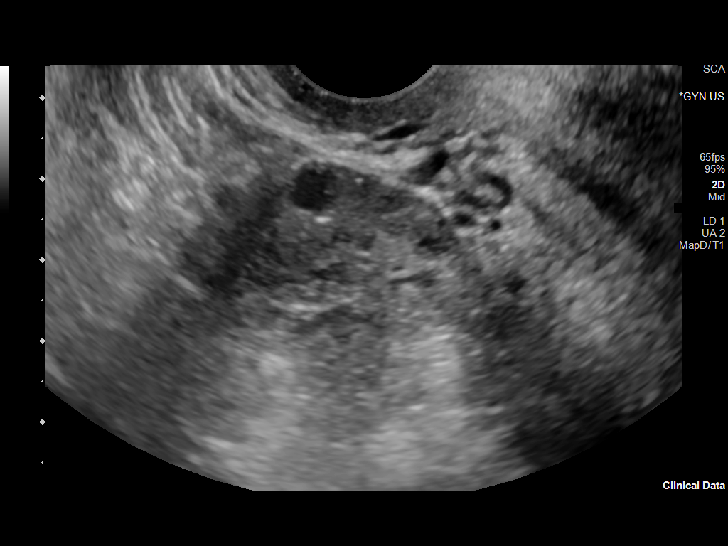
[im 76/102]
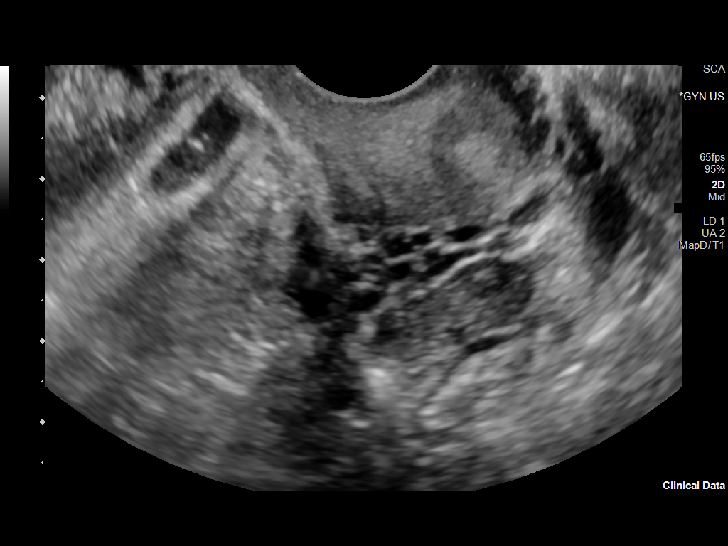
[im 85/102]
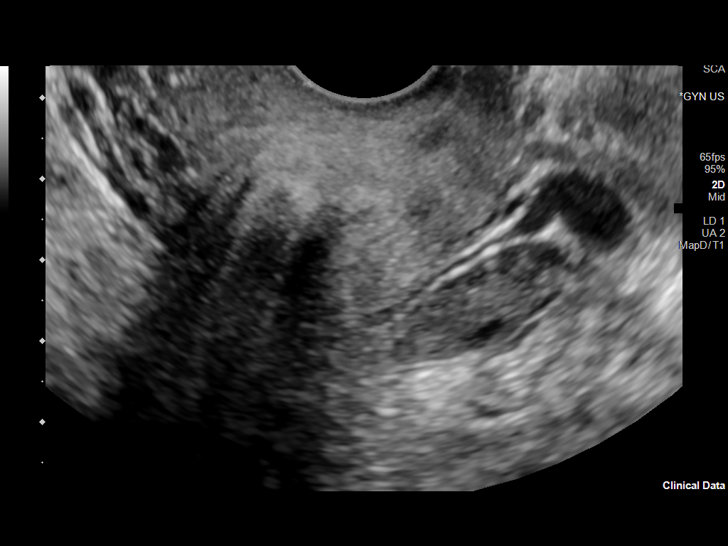
[im 93/102]
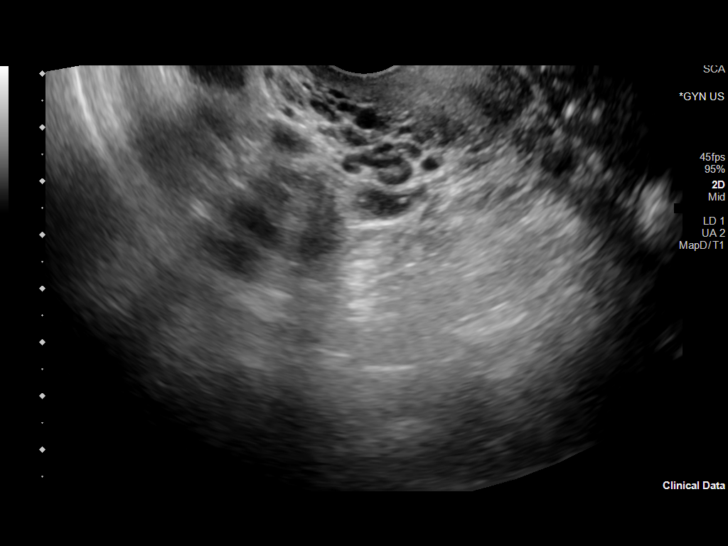
[im 102/102]
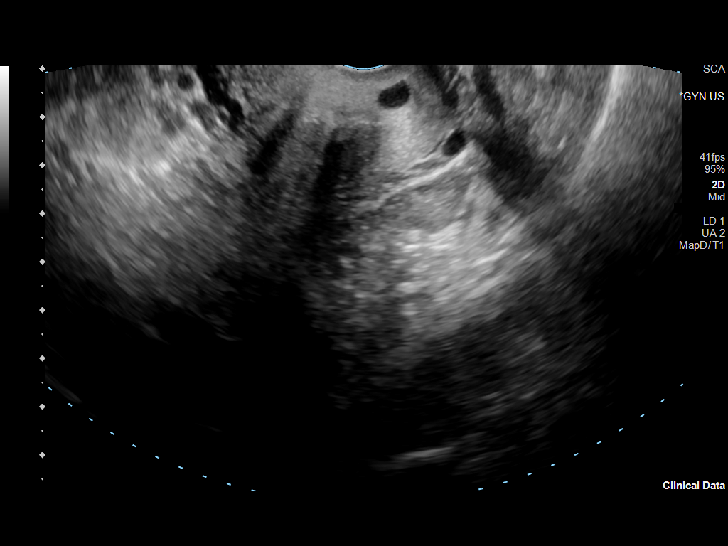

[13 of 25 positions shown; findings below may reference images not displayed]

FINDINGS: Uterus

Measurements: 7.3 x 3.8 x 4.3 cm = volume: 62 mL. Retroverted.
Normal morphology without mass. Nabothian cyst at cervix.

Endometrium

Thickness: 3 mm at upper uterine segment, inadequately visualized at
mid uterus. Probable small amount of endometrial fluid at mid
uterus. Single tiny calcification. No discrete mass.

Right ovary

Measurements: 3.3 x 1.7 x 2.8 cm = volume: 8 mL. Normal morphology
without mass

Left ovary

Measurements: 2.2 x 0.8 x 1.7 cm = volume: 1.6 mL. Normal morphology
without mass

Other findings

No free pelvic fluid.  No adnexal masses.
IMPRESSION: Suboptimal visualization of endometrial complex at mid uterus due to
body habitus and presence of a small amount of nonspecific
endometrial fluid.

If bleeding remains unresponsive to hormonal or medical therapy,
sonohysterogram should be considered for focal lesion work-up. (Ref:
Radiological Reasoning: Algorithmic Workup of Abnormal Vaginal
Bleeding with Endovaginal Sonography and Sonohysterography. AJR
[id number]; 191:[id number])

## 2023-04-10 ENCOUNTER — Telehealth: Payer: 59 | Admitting: Family Medicine

## 2023-04-10 DIAGNOSIS — B001 Herpesviral vesicular dermatitis: Secondary | ICD-10-CM | POA: Diagnosis not present

## 2023-04-10 MED ORDER — VALACYCLOVIR HCL 1 G PO TABS: 2000.0000 mg | ORAL_TABLET | Freq: Two times a day (BID) | ORAL | 0 refills | Status: AC

## 2023-04-10 NOTE — Progress Notes (Signed)

## 2023-05-18 ENCOUNTER — Telehealth: Payer: 59 | Admitting: Nurse Practitioner

## 2023-05-18 DIAGNOSIS — B001 Herpesviral vesicular dermatitis: Secondary | ICD-10-CM | POA: Diagnosis not present

## 2023-05-19 MED ORDER — VALACYCLOVIR HCL 1 G PO TABS: ORAL_TABLET | ORAL | 0 refills | Status: DC

## 2023-05-19 NOTE — Progress Notes (Signed)
We are sorry that you are not feeling well.  Here is how we plan to help!  Based on what you have shared with me it does look like you have a viral infection.    Most cold sores or fever blisters are small fluid filled blisters around the mouth caused by herpes simplex virus.  The most common strain of the virus causing cold sores is herpes simplex virus 1.  It can be spread by skin contact, sharing eating utensils, or even sharing towels.  Cold sores are contagious to other people until dry. (Approximately 5-7 days).  Wash your hands. You can spread the virus to your eyes through handling your contact lenses after touching the lesions.  Most people experience pain at the sight or tingling sensations in their lips that may begin before the ulcers erupt.  Herpes simplex is treatable but not curable.  It may lie dormant for a long time and then reappear due to stress or prolonged sun exposure.  Many patients have success in treating their cold sores with an over the counter topical called Abreva.  You may apply the cream up to 5 times daily (maximum 10 days) until healing occurs.  If you would like to use an oral antiviral medication to speed the healing of your cold sore, I have sent a prescription to your local pharmacy Valacyclovir 2 gm take one by mouth twice a day for 1 day    HOME CARE:  Wash your hands frequently. Do not pick at or rub the sore. Don't open the blisters. Avoid kissing other people during this time. Avoid sharing drinking glasses, eating utensils, or razors. Do not handle contact lenses unless you have thoroughly washed your hands with soap and warm water! Avoid oral sex during this time.  Herpes from sores on your mouth can spread to your partner's genital area. Avoid contact with anyone who has eczema or a weakened immune system. Cold sores are often triggered by exposure to intense sunlight, use a lip balm containing a sunscreen (SPF 30 or higher).  GET HELP RIGHT AWAY  IF:  Blisters look infected. Blisters occur near or in the eye. Symptoms last longer than 10 days. Your symptoms become worse.  MAKE SURE YOU:  Understand these instructions. Will watch your condition. Will get help right away if you are not doing well or get worse.    Your e-visit answers were reviewed by a board certified advanced clinical practitioner to complete your personal care plan.  Depending upon the condition, your plan could have  Included both over the counter or prescription medications.    Please review your pharmacy choice.  Be sure that the pharmacy you have chosen is open so that you can pick up your prescription now.  If there is a problem you can message your provider in MyChart to have the prescription routed to another pharmacy.    Your safety is important to us.  If you have drug allergies check our prescription carefully.  For the next 24 hours you can use MyChart to ask questions about today's visit, request a non-urgent call back, or ask for a work or school excuse from your e-visit provider.  You will get an email in the next two days asking about your experience.  I hope that your e-visit has been valuable and will speed your recovery.  Mary-Margaret Agnes Brightbill, FNP   5-10 minutes spent reviewing and documenting in chart.  

## 2023-06-29 ENCOUNTER — Ambulatory Visit: Payer: 59 | Admitting: Family Medicine

## 2023-07-11 ENCOUNTER — Encounter (INDEPENDENT_AMBULATORY_CARE_PROVIDER_SITE_OTHER): Payer: Self-pay

## 2023-07-23 ENCOUNTER — Encounter: Payer: 59 | Admitting: Family Medicine

## 2023-08-10 ENCOUNTER — Encounter: Payer: 59 | Admitting: Family Medicine

## 2023-09-11 ENCOUNTER — Telehealth: Payer: 59 | Admitting: Physician Assistant

## 2023-09-11 DIAGNOSIS — B001 Herpesviral vesicular dermatitis: Secondary | ICD-10-CM | POA: Diagnosis not present

## 2023-09-11 MED ORDER — VALACYCLOVIR HCL 1 G PO TABS: 2000.0000 mg | ORAL_TABLET | Freq: Two times a day (BID) | ORAL | 0 refills | Status: AC

## 2023-09-11 NOTE — Progress Notes (Signed)
I have spent 5 minutes in review of e-visit questionnaire, review and updating patient chart, medical decision making and response to patient.   Mia Milan Cody Jacklynn Dehaas, PA-C    

## 2023-09-11 NOTE — Progress Notes (Signed)

## 2023-09-14 ENCOUNTER — Ambulatory Visit (INDEPENDENT_AMBULATORY_CARE_PROVIDER_SITE_OTHER): Payer: 59 | Admitting: Family Medicine

## 2023-09-14 ENCOUNTER — Encounter: Payer: Self-pay | Admitting: Family Medicine

## 2023-09-14 VITALS — BP 122/80 | HR 69 | Temp 98.0°F | Ht 63.0 in | Wt 155.4 lb

## 2023-09-14 DIAGNOSIS — Z0000 Encounter for general adult medical examination without abnormal findings: Secondary | ICD-10-CM | POA: Diagnosis not present

## 2023-09-14 DIAGNOSIS — R195 Other fecal abnormalities: Secondary | ICD-10-CM | POA: Diagnosis not present

## 2023-09-14 LAB — CBC
HCT: 40.8 % (ref 36.0–46.0)
Hemoglobin: 13.4 g/dL (ref 12.0–15.0)
MCHC: 32.8 g/dL (ref 30.0–36.0)
MCV: 90.8 fL (ref 78.0–100.0)
Platelets: 294 10*3/uL (ref 150.0–400.0)
RBC: 4.5 Mil/uL (ref 3.87–5.11)
RDW: 12.4 % (ref 11.5–15.5)
WBC: 5.9 10*3/uL (ref 4.0–10.5)

## 2023-09-14 LAB — COMPREHENSIVE METABOLIC PANEL
ALT: 11 U/L (ref 0–35)
AST: 14 U/L (ref 0–37)
Albumin: 4.4 g/dL (ref 3.5–5.2)
Alkaline Phosphatase: 86 U/L (ref 39–117)
BUN: 9 mg/dL (ref 6–23)
CO2: 27 meq/L (ref 19–32)
Calcium: 9.3 mg/dL (ref 8.4–10.5)
Chloride: 105 meq/L (ref 96–112)
Creatinine, Ser: 0.65 mg/dL (ref 0.40–1.20)
GFR: 111.97 mL/min (ref >60.00)
Glucose, Bld: 95 mg/dL (ref 70–99)
Potassium: 4.1 meq/L (ref 3.5–5.1)
Sodium: 139 meq/L (ref 135–145)
Total Bilirubin: 0.5 mg/dL (ref 0.2–1.2)
Total Protein: 7.1 g/dL (ref 6.0–8.3)

## 2023-09-14 LAB — LIPID PANEL
Cholesterol: 183 mg/dL (ref 0–200)
HDL: 59.2 mg/dL (ref >39.00)
LDL Cholesterol: 102 mg/dL — ABNORMAL HIGH (ref 0–99)
NonHDL: 123.92
Total CHOL/HDL Ratio: 3
Triglycerides: 108 mg/dL (ref 0.0–149.0)
VLDL: 21.6 mg/dL (ref 0.0–40.0)

## 2023-09-14 NOTE — Progress Notes (Signed)
Chief Complaint  Patient presents with   Annual Exam     Well Woman [facility] is here for a complete physical.   Her last physical was >1 year ago.  Current diet: in general, diet could be better.  Current exercise: active at work. Fatigue out of ordinary? No Seatbelt? Yes Advanced directive? 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   Late prenatal care 06/20/2017   Low-lying placenta 06/20/2017   Sleep apnea    minimal osa no cpap needed per dr young pulmonary lov 12-07-2021   Tobacco abuse 06/28/2017   Wears contact lenses      Past Surgical History:  Procedure Laterality Date   APPENDECTOMY     14 yrs ago   DILITATION & CURRETTAGE/HYSTROSCOPY WITH NOVASURE ABLATION  08/18/2022   Procedure: DILATATION & CURETTAGE/HYSTEROSCOPY WITH NOVASURE ABLATION WITH ENDOMETRIAL CURETTINGS;  Surgeon: Willodean Rosenthal, MD;  Location: [facility];  Service: Gynecology;;   tubal ligation Bilateral 11/2017   Filshie Clips    Medications  Current Outpatient Medications on File Prior to Visit  Medication Sig Dispense Refill   famotidine (PEPCID) 20 MG tablet Take 20 mg by mouth as needed for heartburn or indigestion.     ibuprofen (ADVIL) 600 MG tablet Take 1 tablet (600 mg total) by mouth every 6 (six) hours as needed for moderate pain or cramping. 20 tablet 0    Allergies Allergies  Allergen Reactions   Amoxicillin     Childhood allergy    Review of Systems: Constitutional:  no unexpected weight changes Eye:  no recent  significant change in vision Ear/Nose/Mouth/Throat:  Ears:  no tinnitus or vertigo and no recent change in hearing Nose/Mouth/Throat:  no complaints of nasal congestion, no sore throat Cardiovascular: no chest pain Respiratory:  no cough and no shortness of breath Gastrointestinal:  +intermittent mucus in stool and gas/pain GU:  Female: negative for dysuria or pelvic pain Musculoskeletal/Extremities: +R hand pain; otherwise no pain of the joints Integumentary (Skin/Breast):  no abnormal skin lesions reported Neurologic:  no headaches Endocrine:  denies fatigue Hematologic/Lymphatic:  No areas of easy bleeding  Exam BP 122/80 (BP Location: Left Arm, Patient Position: Sitting, Cuff Size: Normal)   Pulse 69   Temp 98 F (36.7 C) (Oral)   Ht 5\' 3"  (1.6 m)   Wt 155 lb 6 oz (70.5 kg)   SpO2 99%   BMI 27.52 kg/m  General:  well developed, well nourished, in no apparent distress Skin:  no significant moles, warts, or growths Head:  no masses, lesions, or tenderness Eyes:  pupils equal and round, sclera anicteric without injection Ears:  canals without lesions, TMs shiny without retraction, no obvious effusion, no erythema Nose:  nares patent, mucosa normal, and no drainage  Throat/Pharynx:  lips and gingiva without lesion; tongue and uvula midline; non-inflamed pharynx; no exudates or postnasal drainage Neck: neck supple without adenopathy, thyromegaly, or masses Lungs:  clear to auscultation, breath sounds equal bilaterally, no respiratory distress Cardio:  regular rate  and rhythm, no bruits, no LE edema Abdomen:  abdomen soft, nontender; bowel sounds normal; no masses or organomegaly Genital: Defer to GYN Musculoskeletal: no current ttp over R hand, swelling, edema, erythema; symmetrical muscle groups noted without atrophy or deformity Extremities:  no clubbing, cyanosis, or edema, no deformities, no skin discoloration Neuro:  gait normal; deep tendon reflexes normal and  symmetric Psych: well oriented with normal range of affect and appropriate judgment/insight  Assessment and Plan  Well adult exam - Plan: CBC, Comprehensive metabolic panel, Lipid panel  Mucus in stool - Plan: Ambulatory referral to Gastroenterology   Well 37 y.o. female. Counseled on diet and exercise. Advanced directive form provided today.  Other orders as above. Refer GI for mucus in stool.  Stretches/exercises for hand pain. Send message in 1 mo if no better, will consider OT vs sports med referral.  Follow up in 1 yr. The patient voiced understanding and agreement to the plan.  Jilda Roche [place], DO 09/14/23 10:30 AM

## 2023-09-14 NOTE — Patient Instructions (Addendum)
Give [place] 2-3 business days to get the results of your labs back.   Keep the diet clean and stay active.  Please get me a copy of your advanced directive form at your convenience.   If you do not hear anything about your referral in the next 1-2 weeks, call our office and ask for an update.  Let us know if you need anything.  Hand Exercises Hand exercises can be helpful for almost anyone. These exercises can strengthen the hands, improve flexibility and movement, and increase blood flow to the hands. These results can make work and daily tasks easier. Hand exercises can be especially helpful for people who have joint pain from arthritis or have nerve damage from overuse (carpal tunnel syndrome). These exercises can also help people who have injured a hand. Exercises Most of these hand exercises are gentle stretching and motion exercises. It is usually safe to do them often throughout the day. Warming up your hands before exercise may help to reduce stiffness. You can do this with gentle massage or by placing your hands in warm water for 10-15 minutes. It is normal to feel some stretching, pulling, tightness, or mild discomfort as you begin new exercises. This will gradually improve. Stop an exercise right away if you feel sudden, severe pain or your pain gets worse. Ask your health care provider which exercises are best for you. Knuckle bend or "claw" fist Stand or sit with your arm, hand, and all five fingers pointed straight up. Make sure to keep your wrist straight during the exercise. Gently bend your fingers down toward your palm until the tips of your fingers are touching the top of your palm. Keep your big knuckle straight and just bend the small knuckles in your fingers. Hold this position for 3 seconds. Straighten (extend) your fingers back to the starting position. Repeat this exercise 5-10 times with each hand. Full finger fist Stand or sit with your arm, hand, and all five fingers  pointed straight up. Make sure to keep your wrist straight during the exercise. Gently bend your fingers into your palm until the tips of your fingers are touching the middle of your palm. Hold this position for 3 seconds. Extend your fingers back to the starting position, stretching every joint fully. Repeat this exercise 5-10 times with each hand. Straight fist Stand or sit with your arm, hand, and all five fingers pointed straight up. Make sure to keep your wrist straight during the exercise. Gently bend your fingers at the big knuckle, where your fingers meet your hand, and the middle knuckle. Keep the knuckle at the tips of your fingers straight and try to touch the bottom of your palm. Hold this position for 3 seconds. Extend your fingers back to the starting position, stretching every joint fully. Repeat this exercise 5-10 times with each hand. Tabletop Stand or sit with your arm, hand, and all five fingers pointed straight up. Make sure to keep your wrist straight during the exercise. Gently bend your fingers at the big knuckle, where your fingers meet your hand, as far down as you can while keeping the small knuckles in your fingers straight. Think of forming a tabletop with your fingers. Hold this position for 3 seconds. Extend your fingers back to the starting position, stretching every joint fully. Repeat this exercise 5-10 times with each hand. Finger spread Place your hand flat on a table with your palm facing down. Make sure your wrist stays straight as you do  this exercise. Spread your fingers and thumb apart from each other as far as you can until you feel a gentle stretch. Hold this position for 3 seconds. Bring your fingers and thumb tight together again. Hold this position for 3 seconds. Repeat this exercise 5-10 times with each hand. Making circles Stand or sit with your arm, hand, and all five fingers pointed straight up. Make sure to keep your wrist straight during the  exercise. Make a circle by touching the tip of your thumb to the tip of your index finger. Hold for 3 seconds. Then open your hand wide. Repeat this motion with your thumb and each finger on your hand. Repeat this exercise 5-10 times with each hand. Thumb motion Sit with your forearm resting on a table and your wrist straight. Your thumb should be facing up toward the ceiling. Keep your fingers relaxed as you move your thumb. Lift your thumb up as high as you can toward the ceiling. Hold for 3 seconds. Bend your thumb across your palm as far as you can, reaching the tip of your thumb for the small finger (pinkie) side of your palm. Hold for 3 seconds. Repeat this exercise 5-10 times with each hand. Grip strengthening  Hold a stress ball or other soft ball in the middle of your hand. Slowly increase the pressure, squeezing the ball as much as you can without causing pain. Think of bringing the tips of your fingers into the middle of your palm. All of your finger joints should bend when doing this exercise. Hold your squeeze for 3 seconds, then relax. Repeat this exercise 5-10 times with each hand. Contact a health care provider if: Your hand pain or discomfort gets much worse when you do an exercise. Your hand pain or discomfort does not improve within 2 hours after you exercise. If you have any of these problems, stop doing these exercises right away. Do not do them again unless your health care provider says that you can. Get help right away if: You develop sudden, severe hand pain or swelling. If this happens, stop doing these exercises right away. Do not do them again unless your health care provider says that you can. Make sure you discuss any questions you have with your health care provider. Document Revised: 03/20/2019 Document Reviewed: 11/28/2018 Elsevier Patient Education  2020 [employer].

## 2023-09-27 ENCOUNTER — Ambulatory Visit: Payer: 59 | Admitting: Physician Assistant

## 2023-10-08 ENCOUNTER — Telehealth: Payer: 59 | Admitting: Family Medicine

## 2023-10-08 DIAGNOSIS — B001 Herpesviral vesicular dermatitis: Secondary | ICD-10-CM

## 2023-10-08 NOTE — Progress Notes (Signed)
Because you have been treated this month for this condition you will need to be seen in person. We feel your condition warrants further evaluation and recommend that you be seen in a face to face visit.   NOTE: There will be NO CHARGE for this eVisit   If you are having a true medical emergency please call 911.      For an urgent face to face visit, [facility] has eight urgent care centers for your convenience:   NEW!! [facility] Health Urgent Care Center at [facility] Get Driving Directions [phone] [address], [address] [place], [address]    [facility] Health Urgent Care Center at [facility] Get Driving Directions [phone] [address] [place], [place] [address]   [facility] Urgent [facility] Center [facility]) Get Driving Directions [phone] [address] [place], [place] [address]  [facility] Health Urgent Care Center [facility]) Get Driving Directions [phone] [address] [place],  [place]  [address]  [facility] Health Urgent Care Center [facility] - at [employer]  [phone] [address] [place],  [place] [address]   [facility] Health Urgent Care at [facility] Get Driving Directions [phone] [address] [place], [place] [address]   [facility] Health Urgent Care at [facility] Get Driving Directions  [phone] [address] [place], [place] [address]   [facility] Health Urgent Care at [facility] Directions [phone] [address] [place], [place] [address]  Your MyChart E-visit questionnaire answers were reviewed by a board certified advanced clinical practitioner to complete your personal care plan based on your specific symptoms.  Thank you for using e-Visits.

## 2023-10-24 ENCOUNTER — Telehealth: Payer: 59 | Admitting: Physician Assistant

## 2023-10-24 DIAGNOSIS — Z8619 Personal history of other infectious and parasitic diseases: Secondary | ICD-10-CM | POA: Diagnosis not present

## 2023-10-24 MED ORDER — VALACYCLOVIR HCL 1 G PO TABS: 2000.0000 mg | ORAL_TABLET | Freq: Two times a day (BID) | ORAL | 0 refills | Status: AC

## 2023-10-24 NOTE — Progress Notes (Signed)
E-Visit for [employer]  We are sorry that you are not feeling well.  Here is how we plan to help!  Based on what you have shared with me it does look like you have a viral infection.    Most cold sores or fever blisters are small fluid filled blisters around the mouth caused by herpes simplex virus.  The most common strain of the virus causing cold sores is herpes simplex virus 1.  It can be spread by skin contact, sharing eating utensils, or even sharing towels.  Cold sores are contagious to other people until dry. (Approximately 5-7 days).  Wash your hands. You can spread the virus to your eyes through handling your contact lenses after touching the lesions.  Most people experience pain at the sight or tingling sensations in their lips that may begin before the ulcers erupt.  Herpes simplex is treatable but not curable.  It may lie dormant for a long time and then reappear due to stress or prolonged sun exposure.  Many patients have success in treating their cold sores with an over the counter topical called Abreva.  You may apply the cream up to 5 times daily (maximum 10 days) until healing occurs.  If you would like to use an oral antiviral medication to speed the healing of your cold sore, I have sent a prescription to your local pharmacy Valacyclovir 2 gm take one by mouth twice a day for 1 day    HOME CARE:  Wash your hands frequently. Do not pick at or rub the sore. Don't open the blisters. Avoid kissing other people during this time. Avoid sharing drinking glasses, eating utensils, or razors. Do not handle contact lenses unless you have thoroughly washed your hands with soap and warm water! Avoid oral sex during this time.  Herpes from sores on your mouth can spread to your partner's genital area. Avoid contact with anyone who has eczema or a weakened immune system. Cold sores are often triggered by exposure to intense sunlight, use a lip balm containing a sunscreen (SPF 30 or  higher).  GET HELP RIGHT AWAY IF:  Blisters look infected. Blisters occur near or in the eye. Symptoms last longer than 10 days. Your symptoms become worse.  MAKE SURE YOU:  Understand these instructions. Will watch your condition. Will get help right away if you are not doing well or get worse.    Your e-visit answers were reviewed by a board certified advanced clinical practitioner to complete your personal care plan.  Depending upon the condition, your plan could have  Included both over the counter or prescription medications.    Please review your pharmacy choice.  Be sure that the pharmacy you have chosen is open so that you can pick up your prescription now.  If there is a problem you can message your provider in MyChart to have the prescription routed to another pharmacy.    Your safety is important to [place].  If you have drug allergies check our prescription carefully.  For the next 24 hours you can use MyChart to ask questions about today's visit, request a non-urgent call back, or ask for a work or school excuse from your e-visit provider.  You will get an email in the next two days asking about your experience.  I hope that your e-visit has been valuable and will speed your recovery.

## 2023-10-24 NOTE — Progress Notes (Signed)
I have spent 5 minutes in review of e-visit questionnaire, review and updating patient chart, medical decision making and response to patient.   Mia Milan Cody Jacklynn Dehaas, PA-C    

## 2024-01-10 ENCOUNTER — Other Ambulatory Visit (HOSPITAL_BASED_OUTPATIENT_CLINIC_OR_DEPARTMENT_OTHER): Payer: Self-pay

## 2024-01-10 ENCOUNTER — Ambulatory Visit: Payer: 59 | Admitting: Physician Assistant

## 2024-01-10 ENCOUNTER — Encounter: Payer: Self-pay | Admitting: Physician Assistant

## 2024-01-10 ENCOUNTER — Other Ambulatory Visit (INDEPENDENT_AMBULATORY_CARE_PROVIDER_SITE_OTHER): Payer: 59

## 2024-01-10 VITALS — BP 118/78 | HR 71 | Ht 64.0 in | Wt 160.0 lb

## 2024-01-10 DIAGNOSIS — R141 Gas pain: Secondary | ICD-10-CM

## 2024-01-10 DIAGNOSIS — R143 Flatulence: Secondary | ICD-10-CM

## 2024-01-10 DIAGNOSIS — R195 Other fecal abnormalities: Secondary | ICD-10-CM | POA: Diagnosis not present

## 2024-01-10 DIAGNOSIS — R109 Unspecified abdominal pain: Secondary | ICD-10-CM

## 2024-01-10 LAB — CBC WITH DIFFERENTIAL/PLATELET
Basophils Absolute: 0.1 10*3/uL (ref 0.0–0.1)
Basophils Relative: 1 % (ref 0.0–3.0)
Eosinophils Absolute: 0.1 10*3/uL (ref 0.0–0.7)
Eosinophils Relative: 1.1 % (ref 0.0–5.0)
HCT: 39.8 % (ref 36.0–46.0)
Hemoglobin: 13.3 g/dL (ref 12.0–15.0)
Lymphocytes Relative: 27.9 % (ref 12.0–46.0)
Lymphs Abs: 2.3 10*3/uL (ref 0.7–4.0)
MCHC: 33.5 g/dL (ref 30.0–36.0)
MCV: 91.1 fL (ref 78.0–100.0)
Monocytes Absolute: 0.6 10*3/uL (ref 0.1–1.0)
Monocytes Relative: 6.7 % (ref 3.0–12.0)
Neutro Abs: 5.3 10*3/uL (ref 1.4–7.7)
Neutrophils Relative %: 63.3 % (ref 43.0–77.0)
Platelets: 286 10*3/uL (ref 150.0–400.0)
RBC: 4.37 Mil/uL (ref 3.87–5.11)
RDW: 12.8 % (ref 11.5–15.5)
WBC: 8.3 10*3/uL (ref 4.0–10.5)

## 2024-01-10 LAB — COMPREHENSIVE METABOLIC PANEL
ALT: 12 U/L (ref 0–35)
AST: 16 U/L (ref 0–37)
Albumin: 4.5 g/dL (ref 3.5–5.2)
Alkaline Phosphatase: 83 U/L (ref 39–117)
BUN: 10 mg/dL (ref 6–23)
CO2: 24 meq/L (ref 19–32)
Calcium: 9.3 mg/dL (ref 8.4–10.5)
Chloride: 104 meq/L (ref 96–112)
Creatinine, Ser: 0.63 mg/dL (ref 0.40–1.20)
GFR: 112.56 mL/min (ref >60.00)
Glucose, Bld: 88 mg/dL (ref 70–99)
Potassium: 3.6 meq/L (ref 3.5–5.1)
Sodium: 137 meq/L (ref 135–145)
Total Bilirubin: 0.4 mg/dL (ref 0.2–1.2)
Total Protein: 7.4 g/dL (ref 6.0–8.3)

## 2024-01-10 LAB — SEDIMENTATION RATE: Sed Rate: 21 mm/h — ABNORMAL HIGH (ref 0–20)

## 2024-01-10 LAB — C-REACTIVE PROTEIN: CRP: <1 mg/dL (ref 0.5–20.0)

## 2024-01-10 MED ORDER — HYOSCYAMINE SULFATE 0.125 MG SL SUBL
0.1250 mg | SUBLINGUAL_TABLET | SUBLINGUAL | 3 refills | Status: DC | PRN
  Filled 2024-01-10: qty 40, 7d supply, fill #0

## 2024-01-10 NOTE — Progress Notes (Signed)
Subjective:    Patient ID: Kristin Parrish, female    DOB: 07/13/1986, 38 y.o.   MRN: [id number]  HPI  Kristin Parrish is a pleasant 38 year old white female, new to GI today referred by Dr. Arva Chafe, DO for evaluation of episodic mucoid stools and abdominal cramping.  Patient has not had any prior GI evaluation. She does have history of sleep apnea, migraines, is status post appendectomy and had endometrial ablation in 2023. She says that her current GI symptoms have been present over about the past year, she has not had any change in diet medications etc.  She describes episodes of abdominal cramping, fecal urgency, constant sense of feeling like she may have diarrhea, and goes to the bathroom passes a lot of gas and a lot of clear bubbly mucus with generally no stool.  At these episodes will last for a week or 2, resolve for up to 2 to 3 weeks and then keep recurring.  In between these episodes she will have fairly regular bowel movements.  During these episodes if she does pass stool it will be just small pellets.  She has not noted any melena or hematochezia. Usually in between the episodes she does not have urgency or cramping.  Weight overall the past year has been stable, no nausea or vomiting.  She is not aware of any food intolerances .She does not think that she is lactose intolerant, generally does not drink milk but eats cheese on a regular basis.  She does not use artificial sweeteners.  No daily EtOH.  And generally she says she feels well but is uncomfortable during these episodes.  Review of Systems Pertinent positive and negative review of systems were noted in the above HPI section.  All other review of systems was otherwise negative.   Outpatient Encounter Medications as of 01/10/2024  Medication Sig   hyoscyamine (LEVSIN/SL) 0.125 MG SL tablet Place 1 tablet (0.125 mg total) under the tongue every 4 (four) hours as needed for cramping.   No facility-administered encounter  medications on file as of 01/10/2024.   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   Tobacco user 06/28/2017   Late prenatal care 06/20/2017   Cervical dysplasia 07/17/2016   Social History   Socioeconomic History   Marital status: Married    Spouse name: Not on file   Number of children: 4   Years of education: Not on file   Highest education level: Not on file  Occupational History   Occupation: medical assistant  Tobacco Use   Smoking status: Former    Current packs/day: 0.00    Average packs/day: 0.3 packs/day for 15.0 years (3.8 ttl pk-yrs)    Types: Cigarettes    Start date: 12/11/2005    Quit date: 12/11/2020    Years since quitting: 3.0   Smokeless tobacco: Current   Tobacco comments:    1/2 pack a day [facility] 08/08/21  Vaping Use   Vaping status: Every Day   Substances: Nicotine, Flavoring  Substance and Sexual Activity   Alcohol use: Yes    Comment: occ   Drug use: No   Sexual activity: Yes    Birth control/protection: Pill  Other Topics Concern  Not on file  Social History Narrative   Not on file   Social Drivers of Health   Financial Resource Strain: Not on file  Food Insecurity: Not on file  Transportation Needs: Not on file  Physical Activity: Not on file  Stress: Not on file  Social Connections: Not on file  Intimate Partner Violence: Not on file    [name] family history includes Heart Problems in her paternal grandfather; Lung cancer in her paternal grandmother; Ovarian cancer in her mother.      Objective:    Vitals:   01/10/24 1424  BP: 118/78  Pulse: 71    Physical Exam Well-developed well-nourished white female in no acute distress.  Height,  Weight,160  BMI 27.4  HEENT; nontraumatic normocephalic, EOMI, PE R LA, sclera anicteric. Oropharynx; not examined today Neck; supple, no JVD Cardiovascular; regular rate and rhythm with S1-S2, no murmur rub or gallop Pulmonary; Clear bilaterally Abdomen; soft, nontender, nondistended, no palpable mass or hepatosplenomegaly, bowel sounds are active Rectal; not done today Skin; benign exam, no jaundice rash or appreciable lesions Extremities; no clubbing cyanosis or edema skin warm and dry Neuro/Psych; alert and oriented x4, grossly nonfocal mood and affect appropriate        Assessment & Plan:   #20 38 year old female with 1 year history of intermittent episodes lasting 1 to 2 weeks with urgency/tenesmus type symptoms followed by flatus and large amounts of bubbly mucoid cheerio usually without stool.  She has not noticed any melena or hematochezia.  She may have 2-3 episodes per day.  During these episodes if she does have a bowel movement she usually will have small pellet-like stools.  In between these episodes will have normal bowel movements and no abdominal cramping.  Patient is unaware of any specific food triggers, no changes in diet etc. Etiology of her symptoms is not entirely clear, I suspect IBS related. Rule out IBD, doubt pancreatic insufficiency, but will rule out given description of bubbly mucus.  #2 history of sleep apnea #3 migraine headaches #4.  Status post appendectomy #5.  Endometrial ablation 2023  Plan; CBC with differential, c-Met, sed rate, CRP, TTG and IgA Fecal elastase and fecal calprotectin Patient was asked to track her p.o. intake and try to keep track of any exacerbating factors If above labs and stool studies are unremarkable, can consider CT imaging and/or colonoscopy. Trial of Levsin sublingual every 4 to 6 hours as needed during these episodes. Patient will be established with Dr. Ileana Roup PA-C 01/10/2024   Cc: Sharlene Dory*

## 2024-01-10 NOTE — Patient Instructions (Signed)
Your provider has requested that you go to the basement level for lab work before leaving today. Press "B" on the elevator. The lab is located at the first door on the left as you exit the elevator.   We have sent the following medications to your pharmacy for you to pick up at your convenience: Levsin  Due to recent changes in healthcare laws, you may see the results of your imaging and laboratory studies on MyChart before your provider has had a chance to review them.  We understand that in some cases there may be results that are confusing or concerning to you. Not all laboratory results come back in the same time frame and the provider may be waiting for multiple results in order to interpret others.  Please give [place] 48 hours in order for your provider to thoroughly review all the results before contacting the office for clarification of your results.    I appreciate the  opportunity to care for you  Thank You   Amy Myrtie Hawk

## 2024-01-11 ENCOUNTER — Other Ambulatory Visit (HOSPITAL_BASED_OUTPATIENT_CLINIC_OR_DEPARTMENT_OTHER): Payer: Self-pay

## 2024-01-11 LAB — TISSUE TRANSGLUTAMINASE ABS,IGG,IGA
(tTG) Ab, IgA: <1 U/mL
(tTG) Ab, IgG: <1 U/mL

## 2024-01-11 LAB — IGA: Immunoglobulin A: 201 mg/dL (ref 47–310)

## 2024-01-21 ENCOUNTER — Other Ambulatory Visit: Payer: 59

## 2024-01-21 DIAGNOSIS — R109 Unspecified abdominal pain: Secondary | ICD-10-CM

## 2024-01-21 DIAGNOSIS — R141 Gas pain: Secondary | ICD-10-CM | POA: Diagnosis not present

## 2024-01-21 DIAGNOSIS — R195 Other fecal abnormalities: Secondary | ICD-10-CM | POA: Diagnosis not present

## 2024-01-23 LAB — CALPROTECTIN, FECAL: Calprotectin, Fecal: <5 ug/g (ref 0–120)

## 2024-01-27 LAB — PANCREATIC ELASTASE, FECAL: Pancreatic Elastase-1, Stool: >800 ug/g (ref >200)

## 2024-02-25 NOTE — Progress Notes (Signed)
Agree with the assessment and plan as outlined by Amy Esterwood, PA-C.  Jeremy Mclamb, DO, FACG  

## 2024-07-01 ENCOUNTER — Telehealth: Admitting: Physician Assistant

## 2024-07-01 DIAGNOSIS — B001 Herpesviral vesicular dermatitis: Secondary | ICD-10-CM | POA: Diagnosis not present

## 2024-07-01 MED ORDER — VALACYCLOVIR HCL 1 G PO TABS: 2000.0000 mg | ORAL_TABLET | Freq: Two times a day (BID) | ORAL | 0 refills | Status: AC

## 2024-07-01 NOTE — Progress Notes (Signed)
 I have spent 5 minutes in review of e-visit questionnaire, review and updating patient chart, medical decision making and response to patient.   Piedad Climes, PA-C

## 2024-07-01 NOTE — Progress Notes (Signed)

## 2024-10-28 ENCOUNTER — Encounter: Payer: Self-pay | Admitting: Family Medicine

## 2024-10-28 ENCOUNTER — Ambulatory Visit (INDEPENDENT_AMBULATORY_CARE_PROVIDER_SITE_OTHER): Admitting: Family Medicine

## 2024-10-28 VITALS — BP 124/76 | HR 95 | Temp 98.0°F | Resp 16 | Ht 64.0 in | Wt 155.4 lb

## 2024-10-28 DIAGNOSIS — Z0000 Encounter for general adult medical examination without abnormal findings: Secondary | ICD-10-CM

## 2024-10-28 DIAGNOSIS — Z1159 Encounter for screening for other viral diseases: Secondary | ICD-10-CM | POA: Diagnosis not present

## 2024-10-28 NOTE — Patient Instructions (Addendum)
Give us 2-3 business days to get the results of your labs back.   Keep the diet clean and stay active.  Please consider adding some weight resistance exercise to your routine. Consider yoga as well.   Please get me a copy of your advanced directive form at your convenience.   Let us know if you need anything.  

## 2024-10-28 NOTE — Progress Notes (Signed)
 Chief Complaint  Patient presents with   Annual Exam    CPE     Well Woman Kristin Parrish is here for a complete physical.   Her last physical was >1 year ago.  Current diet: in general, a healthy diet. Current exercise: active at work. Contraception? Yes Fatigue out of ordinary? No Seatbelt? Yes Advanced directive? 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 trimester 06/28/2017   Late prenatal care 06/20/2017   Low-lying placenta 06/20/2017   Sleep apnea    minimal osa no cpap needed per dr young pulmonary lov 12-07-2021   Tobacco abuse 06/28/2017   Wears contact lenses      Past Surgical History:  Procedure Laterality Date   APPENDECTOMY     13 yrs ago   DILITATION & CURRETTAGE/HYSTROSCOPY WITH NOVASURE ABLATION  08/18/2022   Procedure: DILATATION & CURETTAGE/HYSTEROSCOPY WITH NOVASURE ABLATION WITH ENDOMETRIAL CURETTINGS;  Surgeon: Corene Coy, MD;  Location: [facility] [place];  Service: Gynecology;;   ENDOMETRIAL ABLATION     tubal ligation Bilateral 11/2017   Filshie Clips    Medications  Takes no meds routinely.   Allergies Allergies  Allergen Reactions   Amoxicillin     Childhood allergy    Review of Systems: Constitutional:  no unexpected weight changes Eye:  no recent significant change in vision Ear/Nose/Mouth/Throat:  Ears:  no tinnitus or vertigo and no recent change in hearing Nose/Mouth/Throat:  no complaints of nasal congestion, no sore throat Cardiovascular: no  chest pain Respiratory:  no cough and no shortness of breath Gastrointestinal:  no abdominal pain, no change in bowel habits GU:  Female: negative for dysuria or pelvic pain Musculoskeletal/Extremities:  no pain of the joints Integumentary (Skin/Breast):  no abnormal skin lesions reported Neurologic:  no headaches Endocrine:  denies fatigue Hematologic/Lymphatic:  No areas of easy bleeding  Exam BP 124/76 (BP Location: Left Arm, Patient Position: Sitting)   Pulse 95   Temp 98 F (36.7 C) (Oral)   Resp 16   Ht 5' 4 (1.626 m)   Wt 155 lb 6.4 oz (70.5 kg)   SpO2 100%   BMI 26.67 kg/m  General:  well developed, well nourished, in no apparent distress Skin:  no significant moles, warts, or growths Head:  no masses, lesions, or tenderness Eyes:  pupils equal and round, sclera anicteric without injection Ears:  canals without lesions, TMs shiny without retraction, no obvious effusion, no erythema Nose:  nares patent, mucosa normal, and no drainage  Throat/Pharynx:  lips and gingiva without lesion; tongue and uvula midline; non-inflamed pharynx; no exudates or postnasal drainage Neck: neck supple without adenopathy, thyromegaly, or masses Lungs:  clear to auscultation, breath sounds equal bilaterally, no respiratory distress Cardio:  regular rate and rhythm, no bruits, no LE edema Abdomen:  abdomen soft, nontender; bowel sounds normal; no masses or organomegaly Genital: Defer to GYN Musculoskeletal:  symmetrical muscle groups noted without atrophy or deformity Extremities:  no clubbing,  cyanosis, or edema, no deformities, no skin discoloration Neuro:  gait normal; deep tendon reflexes normal and symmetric Psych: well oriented with normal range of affect and appropriate judgment/insight  Assessment and Plan  Well adult exam - Plan: CBC with Differential/Platelet, Comprehensive metabolic panel with GFR, Lipid panel  Need for hepatitis B screening test - Plan: Hepatitis B surface  antibody,quantitative   Well 38 y.o. female. Counseled on diet and exercise. Advanced directive form requested today.  Screen Hep B today.  Other orders as above. Follow up in 1 yr. The patient voiced understanding and agreement to the plan.  Mabel [place], DO 10/28/24 4:26 PM

## 2024-10-29 ENCOUNTER — Ambulatory Visit: Payer: Self-pay | Admitting: Family Medicine

## 2024-10-29 LAB — LIPID PANEL
Cholesterol: 183 mg/dL (ref 0–200)
HDL: 61.1 mg/dL (ref >39.00)
LDL Cholesterol: 111 mg/dL — ABNORMAL HIGH (ref 0–99)
NonHDL: 121.71
Total CHOL/HDL Ratio: 3
Triglycerides: 54 mg/dL (ref 0.0–149.0)
VLDL: 10.8 mg/dL (ref 0.0–40.0)

## 2024-10-29 LAB — COMPREHENSIVE METABOLIC PANEL WITH GFR
ALT: 10 U/L (ref 0–35)
AST: 17 U/L (ref 0–37)
Albumin: 5.1 g/dL (ref 3.5–5.2)
Alkaline Phosphatase: 69 U/L (ref 39–117)
BUN: 11 mg/dL (ref 6–23)
CO2: 26 meq/L (ref 19–32)
Calcium: 9.8 mg/dL (ref 8.4–10.5)
Chloride: 103 meq/L (ref 96–112)
Creatinine, Ser: 0.73 mg/dL (ref 0.40–1.20)
GFR: 103.77 mL/min (ref >60.00)
Glucose, Bld: 74 mg/dL (ref 70–99)
Potassium: 3.7 meq/L (ref 3.5–5.1)
Sodium: 138 meq/L (ref 135–145)
Total Bilirubin: 0.8 mg/dL (ref 0.2–1.2)
Total Protein: 8.1 g/dL (ref 6.0–8.3)

## 2024-10-29 LAB — CBC WITH DIFFERENTIAL/PLATELET
Basophils Absolute: 0.1 K/uL (ref 0.0–0.1)
Basophils Relative: 0.8 % (ref 0.0–3.0)
Eosinophils Absolute: 0.3 K/uL (ref 0.0–0.7)
Eosinophils Relative: 4.1 % (ref 0.0–5.0)
HCT: 42.5 % (ref 36.0–46.0)
Hemoglobin: 14.3 g/dL (ref 12.0–15.0)
Lymphocytes Relative: 38.6 % (ref 12.0–46.0)
Lymphs Abs: 2.8 K/uL (ref 0.7–4.0)
MCHC: 33.6 g/dL (ref 30.0–36.0)
MCV: 90.6 fl (ref 78.0–100.0)
Monocytes Absolute: 0.5 K/uL (ref 0.1–1.0)
Monocytes Relative: 7.1 % (ref 3.0–12.0)
Neutro Abs: 3.6 K/uL (ref 1.4–7.7)
Neutrophils Relative %: 49.4 % (ref 43.0–77.0)
Platelets: 297 K/uL (ref 150.0–400.0)
RBC: 4.69 Mil/uL (ref 3.87–5.11)
RDW: 12.2 % (ref 11.5–15.5)
WBC: 7.3 K/uL (ref 4.0–10.5)

## 2024-10-29 LAB — HEPATITIS B SURFACE ANTIBODY, QUANTITATIVE: Hep B S AB Quant (Post): 366 m[IU]/mL (ref >=10)

## 2024-12-01 ENCOUNTER — Telehealth: Admitting: Physician Assistant

## 2024-12-01 ENCOUNTER — Other Ambulatory Visit (HOSPITAL_BASED_OUTPATIENT_CLINIC_OR_DEPARTMENT_OTHER): Payer: Self-pay

## 2024-12-01 DIAGNOSIS — B001 Herpesviral vesicular dermatitis: Secondary | ICD-10-CM | POA: Diagnosis not present

## 2024-12-01 MED ORDER — VALACYCLOVIR HCL 1 G PO TABS
2000.0000 mg | ORAL_TABLET | Freq: Two times a day (BID) | ORAL | 0 refills | Status: AC
  Filled 2024-12-01: qty 4, 1d supply, fill #0

## 2024-12-01 NOTE — Progress Notes (Signed)
 We are sorry that you are not feeling well.  Here is how we plan to help!  Based on your symptoms, it appears you may have a viral infection.   Cold sores, also known as fever blisters, are small, fluid-filled blisters that typically appear around the mouth. They are caused by the herpes simplex virus, most commonly herpes simplex virus type 1 (HSV-1). The virus spreads through direct skin contact, as well as by sharing items like eating utensils, lip balms, or towels.   Cold sores are contagious until they dry out, which usually takes about 5 to 7 days. Its important to wash your hands frequently, especially after touching the affected area. If you wear contact lenses, avoid handling them after touching a cold sore, as the virus can spread to your eyes and cause complications.   Most people experience pain at the site or tingling sensations in their lips that may begin before the ulcers erupt.  Herpes simplex is treatable but not curable.  It may lie dormant for a long time and then reappear due to stress or prolonged sun exposure.  Many patients have success in treating their cold sores with an over the counter topical called Abreva.  You may apply the cream up to 5 times daily (maximum 10 days) until healing occurs.  To help speed the healing of your cold sore, I have prescribed Valacyclovir  1000 mg -- Take two pills by mouth twice a day for 1 day    HOME CARE:  Wash your hands frequently. Do not pick at or rub the sore. Don't pop the blisters. Avoid kissing other people during this time. Avoid sharing drinking glasses, eating utensils, or razors. Do not handle contact lenses unless you have thoroughly washed your hands with soap and warm water! Avoid oral sex during this time.  Herpes from sores on your mouth can spread to your partner's genital area. Avoid contact with anyone who has eczema or a weakened immune system. Cold sores are often triggered by exposure to intense sunlight,  use a lip balm containing a sunscreen (SPF 30 or higher).  GET HELP RIGHT AWAY IF:  Blisters look infected -- increased redness around the site, warmth of skin, drainage of pus from around the area. Blisters occur near or in the eye. Symptoms last longer than 10 days. Your symptoms worsen.  MAKE SURE YOU:  Understand these instructions. Will watch your condition. Will get help right away if you are not doing well or get worse.  Your e-visit answers were reviewed by a board certified advanced clinical practitioner to complete your personal care plan.  Depending upon the condition, your plan could have included both over the counter or prescription medications.     Please review your pharmacy choice.  Be sure that the pharmacy you have chosen is open so that you can pick up your prescription now.  If there is a problem, you can message your provider in MyChart to have the prescription routed to another pharmacy.     Your safety is important to us .  If you have drug allergies, check our prescription carefully.   For the next 24 hours you can use MyChart to ask questions about today's visit, request a non-urgent call back, or ask for a work or school excuse from your e-visit provider.   You will receive an email in the next two days asking about your experience.  I hope that your e-visit has been valuable and will speed up your recovery.  I have spent 5 minutes in review of e-visit questionnaire, review and updating patient chart, medical decision making and response to patient.   Elsie Velma Lunger, PA-C
